# Patient Record
Sex: Female | Born: 1990 | Race: Black or African American | Hispanic: No | Smoking: Former smoker
Health system: Southern US, Community
[De-identification: ages and names within clinical notes are randomized; demographics above are authoritative.]

## PROBLEM LIST (undated history)

## (undated) ENCOUNTER — Ambulatory Visit (HOSPITAL_COMMUNITY): Payer: MEDICAID | Source: Home / Self Care

## (undated) ENCOUNTER — Emergency Department (HOSPITAL_COMMUNITY): Payer: Medicaid Other

## (undated) DIAGNOSIS — B9689 Other specified bacterial agents as the cause of diseases classified elsewhere: Secondary | ICD-10-CM

## (undated) DIAGNOSIS — R87629 Unspecified abnormal cytological findings in specimens from vagina: Secondary | ICD-10-CM

## (undated) DIAGNOSIS — Z5189 Encounter for other specified aftercare: Secondary | ICD-10-CM

## (undated) DIAGNOSIS — D573 Sickle-cell trait: Secondary | ICD-10-CM

## (undated) DIAGNOSIS — F209 Schizophrenia, unspecified: Secondary | ICD-10-CM

## (undated) DIAGNOSIS — T7840XA Allergy, unspecified, initial encounter: Secondary | ICD-10-CM

## (undated) DIAGNOSIS — D649 Anemia, unspecified: Secondary | ICD-10-CM

## (undated) DIAGNOSIS — G709 Myoneural disorder, unspecified: Secondary | ICD-10-CM

## (undated) DIAGNOSIS — L0591 Pilonidal cyst without abscess: Secondary | ICD-10-CM

## (undated) DIAGNOSIS — A549 Gonococcal infection, unspecified: Secondary | ICD-10-CM

## (undated) HISTORY — DX: Schizophrenia, unspecified: F20.9

## (undated) HISTORY — DX: Other specified bacterial agents as the cause of diseases classified elsewhere: B96.89

## (undated) HISTORY — DX: Allergy, unspecified, initial encounter: T78.40XA

## (undated) HISTORY — DX: Pilonidal cyst without abscess: L05.91

## (undated) HISTORY — PX: STYLOID PROCESS EXCISION: SHX5198

## (undated) HISTORY — DX: Myoneural disorder, unspecified: G70.9

## (undated) HISTORY — DX: Encounter for other specified aftercare: Z51.89

---

## 2012-04-30 ENCOUNTER — Ambulatory Visit: Payer: Self-pay | Admitting: Obstetrics & Gynecology

## 2012-05-08 ENCOUNTER — Ambulatory Visit: Payer: Self-pay

## 2012-05-08 ENCOUNTER — Telehealth: Payer: Self-pay | Admitting: *Deleted

## 2012-05-08 NOTE — Telephone Encounter (Signed)
Patient called wanting to know where her depo was.  returned call- left message for patient to have pharmacy contact the office for RF or she could call with current pharmacy information- and she is currently past due for her annual exam.

## 2012-11-04 ENCOUNTER — Ambulatory Visit: Payer: Self-pay

## 2013-09-10 ENCOUNTER — Encounter (HOSPITAL_COMMUNITY): Payer: Self-pay | Admitting: Emergency Medicine

## 2013-09-10 ENCOUNTER — Emergency Department (HOSPITAL_COMMUNITY)
Admission: EM | Admit: 2013-09-10 | Discharge: 2013-09-10 | Disposition: A | Discharge status: Home / Self Care | Payer: Medicaid Other | Attending: Emergency Medicine | Admitting: Emergency Medicine

## 2013-09-10 DIAGNOSIS — R238 Other skin changes: Secondary | ICD-10-CM | POA: Diagnosis present

## 2013-09-10 DIAGNOSIS — L0591 Pilonidal cyst without abscess: Secondary | ICD-10-CM | POA: Diagnosis not present

## 2013-09-10 DIAGNOSIS — Z79899 Other long term (current) drug therapy: Secondary | ICD-10-CM | POA: Diagnosis not present

## 2013-09-10 DIAGNOSIS — Z792 Long term (current) use of antibiotics: Secondary | ICD-10-CM | POA: Insufficient documentation

## 2013-09-10 MED ORDER — CEPHALEXIN 500 MG PO CAPS: 500.0000 mg | ORAL_CAPSULE | Freq: Four times a day (QID) | ORAL | Status: DC

## 2013-09-10 MED ORDER — CEPHALEXIN 250 MG PO CAPS
500.0000 mg | ORAL_CAPSULE | Freq: Once | ORAL | Status: AC
  Administered 2013-09-10: 500 mg via ORAL
  Filled 2013-09-10: qty 2

## 2013-09-10 MED ORDER — OXYCODONE-ACETAMINOPHEN 5-325 MG PO TABS: 2.0000 | ORAL_TABLET | ORAL | Status: DC | PRN

## 2013-09-10 MED ORDER — OXYCODONE-ACETAMINOPHEN 5-325 MG PO TABS
2.0000 | ORAL_TABLET | Freq: Once | ORAL | Status: AC
  Administered 2013-09-10: 2 via ORAL
  Filled 2013-09-10: qty 2

## 2013-09-10 NOTE — ED Provider Notes (Signed)
CSN: [id number]     Arrival date & time 09/10/13  2101 History  This chart was scribed for Bianca Wong, working with Toy CookeyMegan Docherty, MD found by Elon SpannerGarrett Cook, ED Scribe. This patient was seen in room TR09C/TR09C and the patient's care was started at 10:50 PM.    Chief Complaint  Patient presents with  . Cyst    The history is provided by the patient. No language interpreter was used.    HPI Comments: Bianca Wong is a 23 y.o. female who presents to the Emergency Department complaining of gradually worsening area of in the gluteal cleft.  Patient was seen for this complaint 2 days ago at [facility].  She was prescribed Bactrim as well as pain medication and has been compliant.  She was told to come to ED if sx's worsened.    No past medical history on file. No past surgical history on file. No family history on file. History  Substance Use Topics  . Smoking status: Not on file  . Smokeless tobacco: Not on file  . Alcohol Use: Not on file   OB History   No data available     Review of Systems  Skin: Positive for wound.  All other systems reviewed and are negative.     Allergies  Lactose intolerance (gi)  Home Medications   Prior to Admission medications   Medication Sig Start Date End Date Taking? Authorizing Provider  etonogestrel (IMPLANON) 68 MG IMPL implant Inject 1 each into the skin once. Implanted February 23, 2013   Yes Historical Provider, MD  Ketotifen Fumarate (ALLERGY EYE DROPS OP) Place 1 drop into both eyes daily as needed (allergies/itching).   Yes Historical Provider, MD  oxyCODONE-acetaminophen (PERCOCET/ROXICET) 5-325 MG per tablet Take 1-2 tablets by mouth every 4 (four) hours as needed for severe pain.   Yes Historical Provider, MD  sulfamethoxazole-trimethoprim (BACTRIM DS) 800-160 MG per tablet Take 1 tablet by mouth 2 (two) times daily. 10 day course started 09/08/13 pm   Yes Historical Provider, MD   BP 104/59  Pulse 88  Resp  18  Ht 5' (1.524 m)  Wt 135 lb (61.236 kg)  BMI 26.37 kg/m2  SpO2 100%  LMP 08/24/2013 Physical Exam  Nursing note and vitals reviewed. Constitutional: She appears well-developed and well-nourished. No distress.  HENT:  Head: Normocephalic and atraumatic.  Eyes: Conjunctivae are normal.  Neck: Normal range of motion.  Cardiovascular: Normal rate and regular rhythm.  Exam reveals no gallop and no friction rub.   No murmur heard. Pulmonary/Chest: Effort normal and breath sounds normal. She has no wheezes. She has no rales. She exhibits no tenderness.  Musculoskeletal: Normal range of motion.  Neurological: She is alert.  Speech is goal-oriented. Moves limbs without ataxia.   Skin: Skin is warm and dry.  4x4cm area of tenderness and erythema of gluteal cleft. The area is fluctuant without drainage or open wound.   Psychiatric: She has a normal mood and affect. Her behavior is normal.    ED Course  Procedures (including critical care time)  DIAGNOSTIC STUDIES: Oxygen Saturation is 100% on RA, normal by my interpretation.    COORDINATION OF CARE:  10:56 PM Informed patient of plan to perform I & D.  Patient acknowledges and agrees with plan.    10:59 PM INCISION AND DRAINAGE PROCEDURE NOTE: Patient identification was confirmed and verbal consent was obtained. This procedure was performed by Toy CookeyMegan Docherty, MD at 10:59 PM. Site: gluteal cleft Needle size:  27 gauge Anesthetic used (type and amt): lidocaine 1% with epi Blade size: 11 Drainage: 30mL-serosanguinous Complexity: Complex Packing used:none Site anesthetized, incision made over site, wound drained and explored loculations, rinsed with copious amounts of normal saline, wound packed with sterile gauze, covered with dry, sterile dressing.  Pt tolerated procedure well without complications.  Instructions for care discussed verbally and pt provided with additional written instructions for homecare and f/u.  Labs  Review Labs Reviewed - No data to display  Imaging Review No results found.   EKG Interpretation None      MDM   Final diagnoses:  Pilonidal cyst    Patient will be discharged with keflex to take with her bactrim. Patient will have percocet for pain. The area felt fluctuant on my initial exam but I did not express purulent drainage from the area. Patient will be referred to surgery if symptoms do not improve.   I personally performed the services described in this documentation, which was scribed in my presence. The recorded information has been reviewed and is accurate.   Emilia Beck, PA-C 09/11/13 [phone]

## 2013-09-10 NOTE — ED Notes (Signed)
Declined W/C at D/C and was escorted to lobby by RN. 

## 2013-09-10 NOTE — Discharge Instructions (Signed)
Follow up with the recommended doctor as directed. Take keflex with the antibiotics you are already taking. Take percocet as needed for pain. Refer to attached documents for more information.

## 2013-09-10 NOTE — ED Notes (Signed)
Pt has pylodinal cyst. [facility]. Pt reports it is not improving.

## 2013-09-11 NOTE — ED Provider Notes (Signed)
Medical screening examination/treatment/procedure(s) were performed by non-physician practitioner and as supervising physician I was immediately available for consultation/collaboration.  Shirin Echeverry, MD 09/11/13 2011 

## 2013-09-14 ENCOUNTER — Emergency Department (HOSPITAL_COMMUNITY)
Admission: EM | Admit: 2013-09-14 | Discharge: 2013-09-14 | Disposition: A | Discharge status: Home / Self Care | Payer: Medicaid Other | Source: Home / Self Care | Attending: Emergency Medicine | Admitting: Emergency Medicine

## 2013-09-14 ENCOUNTER — Encounter (HOSPITAL_COMMUNITY): Payer: Self-pay | Admitting: Emergency Medicine

## 2013-09-14 DIAGNOSIS — L0591 Pilonidal cyst without abscess: Secondary | ICD-10-CM

## 2013-09-14 MED ORDER — OXYCODONE-ACETAMINOPHEN 5-325 MG PO TABS: ORAL_TABLET | ORAL | Status: DC

## 2013-09-14 NOTE — Discharge Instructions (Signed)
Continue antibiotics.  Do warm sit down tub bath twice daily.  Apply antibiotic ointment and a gauze dressing.  For constipation take Miralax plus Dulcolax.

## 2013-09-14 NOTE — ED Provider Notes (Signed)
  Chief Complaint   Chief Complaint  Patient presents with  . Follow-up    History of Present Illness   Bianca Wong M Wong is a 23 year old female who presents today for follow up on the pilonidal cyst. Symptoms began around August 3 which was 2 weeks ago. She describes pain and swelling in the intergluteal cleft. She was seen on August 13 in the emergency room and had an incision and drainage. She thinks that no pus resulted and they only got blood back. A couple days later, this began to drain spontaneously on his own at a point just below the incision. It still draining a small amount of serosanguineous fluid. It still painful. She was constipated for several days and then had a painful bowel movement today. [facility], in the emergency room Keflex was added. She still taking both of these meds. A culture was not obtained when this was incised and drained, and it was not packed.  Review of Systems   Other than as noted above, the patient denies any of the following symptoms: Systemic:  No fever, chills or sweats. Skin:  No rash or itching.  PMFSH   Past medical history, family history, social history, meds, and allergies were reviewed.   Physical Examination     Vital signs:  BP 102/69  Pulse 75  Temp(Src) 98.4 F (36.9 C) (Oral)  Resp 16  SpO2 97%  LMP 08/24/2013 Skin:  There is an incision site in the intergluteal cleft that has healed up almost completely. There is a draining sinus just below this which drained a small amount of serous sanguinous fluid. There is no fluctuance. There is still slight induration and pain to palpation.  There was no crepitus, necrosis, ecchymosis, or herrhagic bullae. Skin exam was otherwise normal.  No rash. Ext:  Distal pulses were full, patient has full ROM of all joints.  Course in Urgent Care Center   Antibiotic ointment and a sterile dressing were applied.  Assessment   The encounter diagnosis was  Pilonidal cyst.  This appears to be healing well on its own. I do not think she needs any further incision, drainage, or surgery today.  Plan     1.  Meds:  The following meds were prescribed:   New Prescriptions   OXYCODONE-ACETAMINOPHEN (PERCOCET) 5-325 MG PER TABLET    1 to 2 tablets every 6 hours as needed for pain.   Suggested she continue on with her current antibiotic regimen. I suggested MiraLax and Dulcolax combination for the constipation.  2.  Patient Education/Counseling:  The patient was given appropriate handouts, self care instructions, and instructed in symptomatic relief.  She was instructed in wound care.  3.  Follow up:  The patient was instructed to return again if they should become worse in any way.       Reuben Likesavid C Shanah Guimaraes, MD 09/14/13 [phone]

## 2013-09-14 NOTE — ED Notes (Signed)
Patient seen in ed for cyst, started on bactrim and cephalexin and seen 8/13.  Patient reports draining has occurred recently

## 2014-01-29 DIAGNOSIS — T148XXA Other injury of unspecified body region, initial encounter: Secondary | ICD-10-CM

## 2014-01-29 DIAGNOSIS — S0181XA Laceration without foreign body of other part of head, initial encounter: Secondary | ICD-10-CM

## 2014-01-29 HISTORY — DX: Other injury of unspecified body region, initial encounter: T14.8XXA

## 2014-01-29 HISTORY — DX: Laceration without foreign body of other part of head, initial encounter: S01.81XA

## 2014-12-13 ENCOUNTER — Inpatient Hospital Stay (HOSPITAL_COMMUNITY)
Admission: AD | Admit: 2014-12-13 | Discharge: 2014-12-13 | Disposition: A | Discharge status: Home / Self Care | Payer: Self-pay | Source: Ambulatory Visit | Attending: Family Medicine | Admitting: Family Medicine

## 2014-12-13 ENCOUNTER — Encounter (HOSPITAL_COMMUNITY): Payer: Self-pay | Admitting: *Deleted

## 2014-12-13 ENCOUNTER — Emergency Department (HOSPITAL_COMMUNITY)
Admission: EM | Admit: 2014-12-13 | Discharge: 2014-12-13 | Disposition: A | Discharge status: Home / Self Care | Payer: Medicaid Other | Source: Home / Self Care

## 2014-12-13 DIAGNOSIS — A499 Bacterial infection, unspecified: Secondary | ICD-10-CM

## 2014-12-13 DIAGNOSIS — N760 Acute vaginitis: Secondary | ICD-10-CM | POA: Insufficient documentation

## 2014-12-13 DIAGNOSIS — B9689 Other specified bacterial agents as the cause of diseases classified elsewhere: Secondary | ICD-10-CM | POA: Insufficient documentation

## 2014-12-13 HISTORY — DX: Sickle-cell trait: D57.3

## 2014-12-13 HISTORY — DX: Anemia, unspecified: D64.9

## 2014-12-13 HISTORY — DX: Gonococcal infection, unspecified: A54.9

## 2014-12-13 HISTORY — DX: Unspecified abnormal cytological findings in specimens from vagina: R87.629

## 2014-12-13 LAB — URINALYSIS, ROUTINE W REFLEX MICROSCOPIC
Bilirubin Urine: NEGATIVE
Glucose, UA: NEGATIVE mg/dL
Ketones, ur: NEGATIVE mg/dL
LEUKOCYTES UA: NEGATIVE
Nitrite: NEGATIVE
PROTEIN: NEGATIVE mg/dL
Specific Gravity, Urine: 1.02 (ref 1.005–1.030)
Urobilinogen, UA: 2 mg/dL — ABNORMAL HIGH (ref 0.0–1.0)
pH: 6.5 (ref 5.0–8.0)

## 2014-12-13 LAB — URINE MICROSCOPIC-ADD ON

## 2014-12-13 LAB — POCT PREGNANCY, URINE: PREG TEST UR: NEGATIVE

## 2014-12-13 LAB — WET PREP, GENITAL
TRICH WET PREP: NONE SEEN
Yeast Wet Prep HPF POC: NONE SEEN

## 2014-12-13 MED ORDER — IBUPROFEN 800 MG PO TABS: 800.0000 mg | ORAL_TABLET | Freq: Once | ORAL | Status: DC

## 2014-12-13 MED ORDER — METRONIDAZOLE 500 MG PO TABS: 500.0000 mg | ORAL_TABLET | Freq: Two times a day (BID) | ORAL | Status: DC

## 2014-12-13 NOTE — MAU Provider Note (Signed)
History    nullparous in with c/o intermittant abd pain for about a year.Has nexplanon implant device for over a year. Pt is very vague about pain. Menses present. CSN: [id number]  Arrival date & time 12/13/14  [id number]   First Provider Initiated Contact with Patient 12/13/14 1939      No chief complaint on file.   HPI  History reviewed. No pertinent past medical history.  History reviewed. No pertinent past surgical history.  No family history on file.  Social History  Substance Use Topics  . Smoking status: Current Every Day Smoker -- 0.50 packs/day    Types: Cigarettes  . Smokeless tobacco: None  . Alcohol Use: Yes    OB History    Gravida Para Term Preterm AB TAB SAB Ectopic Multiple Living   0 0 0 0 0 0 0 0 0 0       Review of Systems  Constitutional: Negative.   HENT: Negative.   Eyes: Negative.   Respiratory: Negative.   Cardiovascular: Negative.   Gastrointestinal: Positive for abdominal pain.  Endocrine: Negative.   Genitourinary: Negative.   Musculoskeletal: Negative.   Skin: Negative.   Allergic/Immunologic: Negative.   Neurological: Negative.   Hematological: Negative.   Psychiatric/Behavioral: Negative.     Allergies  Lactose intolerance (gi)  Home Medications  No current outpatient prescriptions on file.  LMP 12/13/2014 (Exact Date)  Physical Exam  Constitutional: She is oriented to person, place, and time. She appears well-developed and well-nourished.  HENT:  Head: Normocephalic.  Eyes: Pupils are equal, round, and reactive to light.  Neck: Normal range of motion.  Cardiovascular: Normal rate, regular rhythm, normal heart sounds and intact distal pulses.   Pulmonary/Chest: Effort normal and breath sounds normal.  Abdominal: Soft. Bowel sounds are normal.  Genitourinary: Vagina normal and uterus normal.  Musculoskeletal: Normal range of motion.  Neurological: She is alert and oriented to person, place, and time. She has normal reflexes.   Skin: Skin is warm and dry.  Psychiatric: She has a normal mood and affect. Her behavior is normal. Judgment and thought content normal.    MAU Course  Procedures (including critical care time)  Labs Reviewed  WET PREP, GENITAL  URINALYSIS, ROUTINE W REFLEX MICROSCOPIC (NOT AT [facility])  POCT PREGNANCY, URINE  GC/CHLAMYDIA PROBE AMP ([place]) NOT AT [facility]   No results found.   No diagnosis found.    MDM  Wet prep, gc,chla done. Uterus NSSC, no abnorma discharge.

## 2014-12-13 NOTE — MAU Note (Signed)
Pt c.o of lower back pain along with cycles since having implant. And concerned about pulse rate being different on each arm as she was told by [employer] instructor.

## 2014-12-13 NOTE — Discharge Instructions (Signed)

## 2014-12-14 LAB — GC/CHLAMYDIA PROBE AMP (~~LOC~~) NOT AT ARMC
Chlamydia: NEGATIVE
NEISSERIA GONORRHEA: NEGATIVE

## 2015-01-19 ENCOUNTER — Encounter (HOSPITAL_COMMUNITY): Payer: Self-pay | Admitting: Emergency Medicine

## 2015-01-19 ENCOUNTER — Emergency Department (HOSPITAL_COMMUNITY)
Admission: EM | Admit: 2015-01-19 | Discharge: 2015-01-19 | Disposition: A | Discharge status: Home / Self Care | Payer: Self-pay | Attending: Emergency Medicine | Admitting: Emergency Medicine

## 2015-01-19 DIAGNOSIS — Z793 Long term (current) use of hormonal contraceptives: Secondary | ICD-10-CM | POA: Insufficient documentation

## 2015-01-19 DIAGNOSIS — Z4802 Encounter for removal of sutures: Secondary | ICD-10-CM | POA: Insufficient documentation

## 2015-01-19 DIAGNOSIS — H539 Unspecified visual disturbance: Secondary | ICD-10-CM | POA: Insufficient documentation

## 2015-01-19 DIAGNOSIS — Z791 Long term (current) use of non-steroidal anti-inflammatories (NSAID): Secondary | ICD-10-CM | POA: Insufficient documentation

## 2015-01-19 DIAGNOSIS — F17210 Nicotine dependence, cigarettes, uncomplicated: Secondary | ICD-10-CM | POA: Insufficient documentation

## 2015-01-19 DIAGNOSIS — Z792 Long term (current) use of antibiotics: Secondary | ICD-10-CM | POA: Insufficient documentation

## 2015-01-19 DIAGNOSIS — Z87828 Personal history of other (healed) physical injury and trauma: Secondary | ICD-10-CM | POA: Insufficient documentation

## 2015-01-19 DIAGNOSIS — Z862 Personal history of diseases of the blood and blood-forming organs and certain disorders involving the immune mechanism: Secondary | ICD-10-CM | POA: Insufficient documentation

## 2015-01-19 DIAGNOSIS — Z8619 Personal history of other infectious and parasitic diseases: Secondary | ICD-10-CM | POA: Insufficient documentation

## 2015-01-19 NOTE — ED Notes (Signed)
See PA assessment 

## 2015-01-19 NOTE — Discharge Instructions (Signed)
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
## 2015-01-19 NOTE — ED Notes (Signed)
Pt to ER to have staples removed after having them placed one week ago for a laceration to the head. Pt is a/o x4. VSS. NAD. Pt denies any issues with staples.

## 2015-08-11 ENCOUNTER — Encounter (HOSPITAL_COMMUNITY): Payer: Self-pay | Admitting: Emergency Medicine

## 2015-08-11 ENCOUNTER — Ambulatory Visit (HOSPITAL_COMMUNITY)
Admission: EM | Admit: 2015-08-11 | Discharge: 2015-08-11 | Disposition: A | Discharge status: Home / Self Care | Payer: Self-pay | Attending: Family Medicine | Admitting: Family Medicine

## 2015-08-11 DIAGNOSIS — Z8619 Personal history of other infectious and parasitic diseases: Secondary | ICD-10-CM | POA: Insufficient documentation

## 2015-08-11 DIAGNOSIS — N898 Other specified noninflammatory disorders of vagina: Secondary | ICD-10-CM | POA: Insufficient documentation

## 2015-08-11 DIAGNOSIS — D573 Sickle-cell trait: Secondary | ICD-10-CM | POA: Insufficient documentation

## 2015-08-11 DIAGNOSIS — F17210 Nicotine dependence, cigarettes, uncomplicated: Secondary | ICD-10-CM | POA: Insufficient documentation

## 2015-08-11 DIAGNOSIS — R1030 Lower abdominal pain, unspecified: Secondary | ICD-10-CM | POA: Insufficient documentation

## 2015-08-11 LAB — POCT PREGNANCY, URINE: Preg Test, Ur: NEGATIVE

## 2015-08-11 LAB — POCT URINALYSIS DIP (DEVICE)
BILIRUBIN URINE: NEGATIVE
Glucose, UA: NEGATIVE mg/dL
KETONES UR: 40 mg/dL — AB
Leukocytes, UA: NEGATIVE
Nitrite: NEGATIVE
Protein, ur: NEGATIVE mg/dL
Specific Gravity, Urine: 1.01 (ref 1.005–1.030)
Urobilinogen, UA: 0.2 mg/dL (ref 0.0–1.0)
pH: 5 (ref 5.0–8.0)

## 2015-08-11 MED ORDER — METRONIDAZOLE 500 MG PO TABS: 500.0000 mg | ORAL_TABLET | Freq: Two times a day (BID) | ORAL | Status: DC

## 2015-08-11 NOTE — ED Provider Notes (Signed)
CSN: [id number]     Arrival date & time 08/11/15  1517 History   First MD Initiated Contact with Patient 08/11/15 1607     Chief Complaint  Patient presents with  . Vaginal Discharge  . Abdominal Pain   (Consider location/radiation/quality/duration/timing/severity/associated sxs/prior Treatment) HPI  Bianca Wong is a 25 y.o. female presenting to UC with c/o vaginal discharge and intermittent, mild cramping lower abdominal pain for about 2 weeks. Hx of BV, pt concerned she may have BV again.  Low concern for STDs as she has been using protection and no recent intercourse. Denies fever, chills, n/v/d. Denies dysuria.   Past Medical History  Diagnosis Date  . Vaginal Pap smear, abnormal   . Gonorrhea   . Anemia   . Sickle cell trait [facility](HCC)    Past Surgical History  Procedure Laterality Date  . Styloid process excision     History reviewed. No pertinent family history. Social History  Substance Use Topics  . Smoking status: Current Every Day Smoker -- 0.50 packs/day    Types: Cigarettes  . Smokeless tobacco: None  . Alcohol Use: 0.6 oz/week    1 Glasses of wine per week     Comment: per day   OB History    Gravida Para Term Preterm AB TAB SAB Ectopic Multiple Living   0 0 0 0 0 0 0 0 0 0      Review of Systems  Constitutional: Negative for fever and chills.  Gastrointestinal: Positive for abdominal pain. Negative for nausea, vomiting and diarrhea.  Genitourinary: Positive for vaginal discharge and pelvic pain. Negative for dysuria, urgency, frequency, genital sores and vaginal pain.  Musculoskeletal: Negative for myalgias and back pain.    Allergies  Lactose intolerance (gi)  Home Medications   Prior to Admission medications   Medication Sig Start Date End Date Taking? Authorizing Provider  etonogestrel (IMPLANON) 68 MG IMPL implant Inject 1 each into the skin once. Implanted February 23, 2013   Yes Historical Provider, MD  ibuprofen (ADVIL,MOTRIN) 800  MG tablet Take 1 tablet (800 mg total) by mouth once. 12/13/14   Montez MoritaMarie D Lawson, CNM  Ketotifen Fumarate (ALLERGY EYE DROPS OP) Place 1 drop into both eyes daily as needed (allergies/itching).    Historical Provider, MD  metroNIDAZOLE (FLAGYL) 500 MG tablet Take 1 tablet (500 mg total) by mouth 2 (two) times daily. 12/13/14   Montez MoritaMarie D Lawson, CNM  metroNIDAZOLE (FLAGYL) 500 MG tablet Take 1 tablet (500 mg total) by mouth 2 (two) times daily. One po bid x 7 days 08/11/15   Junius FinnerErin O'Malley, PA-C  oxyCODONE-acetaminophen (PERCOCET) 5-325 MG per tablet 1 to 2 tablets every 6 hours as needed for pain. 09/14/13   Reuben Likesavid C Keller, MD  oxyCODONE-acetaminophen (PERCOCET/ROXICET) 5-325 MG per tablet Take 1-2 tablets by mouth every 4 (four) hours as needed for severe pain.    Historical Provider, MD  oxyCODONE-acetaminophen (PERCOCET/ROXICET) 5-325 MG per tablet Take 2 tablets by mouth every 4 (four) hours as needed for moderate pain or severe pain. 09/10/13   Emilia BeckKaitlyn Szekalski, PA-C   Meds Ordered and Administered this Visit  Medications - No data to display  BP 103/66 mmHg  Pulse 64  Temp(Src) 98.7 F (37.1 C) (Oral)  Resp 18  SpO2 100%  LMP 07/12/2015 (Approximate) No data found.   Physical Exam  Constitutional: She is oriented to person, place, and time. She appears well-developed and well-nourished.  HENT:  Head: Normocephalic and atraumatic.  Eyes: EOM are  normal.  Neck: Normal range of motion.  Cardiovascular: Normal rate.   Pulmonary/Chest: Effort normal. No respiratory distress.  Abdominal: Soft. She exhibits no distension. There is no tenderness.  Genitourinary: Pelvic exam was performed with patient supine. Vaginal discharge found.  Normal external genitalia.  Mild malodorous clear-white vaginal discharge. No CMT, adnexal tenderness or masses.   Musculoskeletal: Normal range of motion.  Neurological: She is alert and oriented to person, place, and time.  Skin: Skin is warm and dry.   Psychiatric: She has a normal mood and affect. Her behavior is normal.  Nursing note and vitals reviewed.   ED Course  Procedures (including critical care time)  Labs Review Labs Reviewed  POCT URINALYSIS DIP (DEVICE) - Abnormal; Notable for the following:    Ketones, ur 40 (*)    Hgb urine dipstick SMALL (*)    All other components within normal limits  POCT PREGNANCY, URINE  CERVICOVAGINAL ANCILLARY ONLY    Imaging Review No results found.   MDM   1. Vaginal discharge   2. Lower abdominal pain    Pt c/o lower abdominal cramping and discharge.  UA: small Hgb and ketones. Pt denies dysuria. Pt notes she is due for menstrual cycle "any day now" Urine preg: negative  Wet prep and GC/chlamydia labs sent. Will start treatment for BV with Flagyl. Advised not to drink alcohol while taking. Patient verbalized understanding and agreement with treatment plan.     Junius Finner, PA-C 08/11/15 1747

## 2015-08-11 NOTE — ED Notes (Signed)
[facility] with a complaint of abdominal pain off and on and a vaginal discharge for 2 weeks.

## 2015-08-11 NOTE — Discharge Instructions (Signed)
°  Do not drink alcohol while taking metronidazole (Flagyl) as it may cause severe nausea and vomiting.

## 2015-08-12 LAB — CERVICOVAGINAL ANCILLARY ONLY
Chlamydia: NEGATIVE
Neisseria Gonorrhea: NEGATIVE
Wet Prep (BD Affirm): POSITIVE — AB

## 2015-08-15 ENCOUNTER — Telehealth (HOSPITAL_COMMUNITY): Payer: Self-pay | Admitting: Emergency Medicine

## 2015-08-15 NOTE — Telephone Encounter (Signed)
Called pt and notified of recent lab results from visit 7/13 Pt ID'd properly... Reports feeling better and vag d/c has subsided Adv pt if sx are not getting better to return  Pt verb understanding Education on safe sex given  Per Dr. Dayton ScrapeMurray,   Notes Recorded by Eustace MooreLaura W Murray, MD on 08/13/2015 at 11:05 AM Please let patient know that test for gardnerella (bacterial vaginosis) was positive; rx [facility] metronidazole [facility].  Tests for gonorrhea/chlamydia were negative. Recheck as needed if symptoms persist. LM

## 2016-10-07 ENCOUNTER — Ambulatory Visit (HOSPITAL_COMMUNITY)
Admission: EM | Admit: 2016-10-07 | Discharge: 2016-10-07 | Disposition: A | Discharge status: Home / Self Care | Payer: Self-pay | Attending: Emergency Medicine | Admitting: Emergency Medicine

## 2016-10-07 ENCOUNTER — Encounter (HOSPITAL_COMMUNITY): Payer: Self-pay | Admitting: Emergency Medicine

## 2016-10-07 ENCOUNTER — Telehealth (HOSPITAL_COMMUNITY): Payer: Self-pay | Admitting: Emergency Medicine

## 2016-10-07 DIAGNOSIS — D573 Sickle-cell trait: Secondary | ICD-10-CM | POA: Insufficient documentation

## 2016-10-07 DIAGNOSIS — J028 Acute pharyngitis due to other specified organisms: Secondary | ICD-10-CM

## 2016-10-07 DIAGNOSIS — J069 Acute upper respiratory infection, unspecified: Secondary | ICD-10-CM | POA: Insufficient documentation

## 2016-10-07 DIAGNOSIS — F17210 Nicotine dependence, cigarettes, uncomplicated: Secondary | ICD-10-CM | POA: Insufficient documentation

## 2016-10-07 DIAGNOSIS — J029 Acute pharyngitis, unspecified: Secondary | ICD-10-CM | POA: Insufficient documentation

## 2016-10-07 DIAGNOSIS — A549 Gonococcal infection, unspecified: Secondary | ICD-10-CM | POA: Insufficient documentation

## 2016-10-07 DIAGNOSIS — E739 Lactose intolerance, unspecified: Secondary | ICD-10-CM | POA: Insufficient documentation

## 2016-10-07 LAB — POCT RAPID STREP A: Streptococcus, Group A Screen (Direct): NEGATIVE

## 2016-10-07 MED ORDER — FLUTICASONE PROPIONATE 50 MCG/ACT NA SUSP: 2.0000 | Freq: Every day | NASAL | 2 refills | Status: DC

## 2016-10-07 MED ORDER — PREDNISONE 20 MG PO TABS
40.0000 mg | ORAL_TABLET | Freq: Once | ORAL | Status: AC
  Administered 2016-10-07: 40 mg via ORAL

## 2016-10-07 MED ORDER — PREDNISONE 20 MG PO TABS
ORAL_TABLET | ORAL | Status: AC
  Filled 2016-10-07: qty 2

## 2016-10-07 MED ORDER — CEFDINIR 300 MG PO CAPS: 300.0000 mg | ORAL_CAPSULE | Freq: Two times a day (BID) | ORAL | 0 refills | Status: DC

## 2016-10-07 NOTE — Discharge Instructions (Signed)
Push fluids.  If you feel like you are not improving and become lethargic follow up with your primary care provider.

## 2016-10-07 NOTE — ED Triage Notes (Signed)
Onset 2 days ago of throat pain, particularly left side of throat.  Gradually the entire throat started hurting.  No cough, intermittent sinus drainage.  Throat pain keeps worsening and body aches.

## 2016-10-07 NOTE — ED Provider Notes (Signed)
[facility]    CSN: [id number] Arrival date & time: 10/07/16  1606    History   Chief Complaint Chief Complaint  Patient presents with  . URI    HPI   Bianca Wong is a 26 y.o. female presenting today with reports of a sore throat and difficulty swallowing secondary to pain. Patient states she had an onset of just a generalized illness and malaise with fever chills and sweating for about 4-5 days. Patient states she's been taking over-the-counter generic DayQuil and NyQuil. Patient reports last dose approximately 2 hours ago. Reports seasonal allergies for which she states she usually only uses eyedrops once her eyes began to water and itch. She poured she has been pushing fluid as she has sickle cell trait.  Past Medical History:  Diagnosis Date  . Anemia   . Gonorrhea   . Sickle cell trait (HCC)   . Vaginal Pap smear, abnormal     There are no active problems to display for this patient.   Past Surgical History:  Procedure Laterality Date  . STYLOID PROCESS EXCISION      OB History    Gravida Para Term Preterm AB Living   0 0 0 0 0 0   SAB TAB Ectopic Multiple Live Births   0 0 0 0         Home Medications    Prior to Admission medications   Medication Sig Start Date End Date Taking? Authorizing Provider  OVER THE COUNTER MEDICATION Day and night cold medicine   Yes [provider]  cefdinir (OMNICEF) 300 MG capsule Take 1 capsule (300 mg total) by mouth 2 (two) times daily. 10/07/16   Servando Salina, NP  etonogestrel (IMPLANON) 68 MG IMPL implant Inject 1 each into the skin once. Implanted February 23, 2013    [provider]  fluticasone [facility]) 50 MCG/ACT nasal spray Place 2 sprays into both nostrils daily. 10/07/16   Servando Salina, NP  ibuprofen (ADVIL,MOTRIN) 800 MG tablet Take 1 tablet (800 mg total) by mouth once. 12/13/14   Montez Morita, CNM  Ketotifen Fumarate (ALLERGY EYE DROPS OP) Place 1 drop into  both eyes daily as needed (allergies/itching).    [provider]  metroNIDAZOLE (FLAGYL) 500 MG tablet Take 1 tablet (500 mg total) by mouth 2 (two) times daily. 12/13/14   Montez Morita, CNM  metroNIDAZOLE (FLAGYL) 500 MG tablet Take 1 tablet (500 mg total) by mouth 2 (two) times daily. One po bid x 7 days 08/11/15   Lurene Shadow, PA-C  oxyCODONE-acetaminophen (PERCOCET) 5-325 MG per tablet 1 to 2 tablets every 6 hours as needed for pain. 09/14/13   Reuben Likes, MD  oxyCODONE-acetaminophen (PERCOCET/ROXICET) 5-325 MG per tablet Take 1-2 tablets by mouth every 4 (four) hours as needed for severe pain.    [provider]  oxyCODONE-acetaminophen (PERCOCET/ROXICET) 5-325 MG per tablet Take 2 tablets by mouth every 4 (four) hours as needed for moderate pain or severe pain. 09/10/13   Emilia Beck, PA-C    Family History No family history on file.  Social History Social History  Substance Use Topics  . Smoking status: Current Every Day Smoker    Packs/day: 0.50    Types: Cigarettes  . Smokeless tobacco: Not on file  . Alcohol use 0.6 oz/week    1 Glasses of wine per week     Comment: per day     Allergies   Lactose  intolerance (gi)   Review of Systems Review of Systems  Constitutional: Positive for chills, fatigue and fever.  HENT: Positive for congestion, ear pain, sinus pressure, sore throat and trouble swallowing.   Eyes: Negative.  Negative for visual disturbance.  Respiratory: Negative.  Negative for cough and shortness of breath.   Cardiovascular: Negative.  Negative for chest pain and leg swelling.  Gastrointestinal: Negative.   Endocrine: Negative.   Genitourinary: Negative.   Musculoskeletal: Negative.  Negative for gait problem and neck stiffness.  Skin: Negative.  Negative for rash.  Allergic/Immunologic: Positive for environmental allergies.  Neurological: Negative.  Negative for dizziness and headaches.  Hematological: Negative.     Psychiatric/Behavioral: Negative.      Physical Exam Triage Vital Signs ED Triage Vitals  Enc Vitals Group     BP 10/07/16 1646 108/61     Pulse Rate 10/07/16 1646 (!) 107     Resp 10/07/16 1646 18     Temp 10/07/16 1646 (!) 100.7 F (38.2 C)     Temp Source 10/07/16 1646 Oral     SpO2 10/07/16 1646 100 %     Weight --      Height --      Head Circumference --      Peak Flow --      Pain Score 10/07/16 1640 9     Pain Loc --      Pain Edu? --      Excl. in GC? --    No data found.   Updated Vital Signs BP 108/61 (BP Location: Left Arm)   Pulse (!) 107   Temp (!) 100.7 F (38.2 C) (Oral)   Resp 18   SpO2 100%   Physical Exam  Constitutional: She appears well-developed and well-nourished. No distress.  HENT:  Head: Normocephalic and atraumatic.  Right Ear: External ear normal.  Left Ear: External ear normal.  Mouth/Throat: Oropharynx is clear and moist. No oropharyngeal exudate.  Bilateral nasal turbinates are boggy and swollen in appearance. Nares are patent bilaterally. Posterior oropharynx swollen and red in appearance. Right tonsil is 4+ left tonsil is 3+ no evidence of abscess or exudate. Right tonsil has white patches.  Eyes: Pupils are equal, round, and reactive to light. Right eye exhibits no discharge. Left eye exhibits no discharge. No scleral icterus.  Neck: Normal range of motion. Neck supple. No tracheal deviation present.  Moderate bilateral cervical lymphadenopathy.  Cardiovascular: Regular rhythm, normal heart sounds and intact distal pulses.  Exam reveals no gallop and no friction rub.   No murmur heard. Patient is mildly tachycardic with a low-grade fever.  Pulmonary/Chest: Effort normal and breath sounds normal. No stridor. No respiratory distress. She has no wheezes. She has no rales. She exhibits no tenderness.  Lymphadenopathy:    She has cervical adenopathy.  Skin: Skin is warm and dry. No rash noted. She is not diaphoretic. No erythema. No  pallor.  Nursing note and vitals reviewed.    UC Treatments / Results  Labs (all labs ordered are listed, but only abnormal results are displayed) Labs Reviewed  POCT RAPID STREP A   Results for orders placed or performed during the hospital encounter of 10/07/16  POCT rapid strep A [facility])  Result Value Ref Range   Streptococcus, Group A Screen (Direct) NEGATIVE NEGATIVE    EKG  EKG Interpretation None       Radiology No results found.  Procedures Procedures (including critical care time)  Medications Ordered in UC  Medications  predniSONE (DELTASONE) tablet 40 mg (40 mg Oral Given 10/07/16 1712)     Initial Impression / Assessment and Plan / UC Course  I have reviewed the triage vital signs and the nursing notes.  Pertinent labs & imaging results that were available during my care of the patient were reviewed by me and considered in my medical decision making (see chart for details).  Most likely recent viral illness with bacterial exacerbation from sinus drainage, facilitating a bacterial pharyngitis.  Final Clinical Impressions(s) / UC Diagnoses   Final diagnoses:  Acute pharyngitis due to other specified organisms    New Prescriptions New Prescriptions   CEFDINIR (OMNICEF) 300 MG CAPSULE    Take 1 capsule (300 mg total) by mouth 2 (two) times daily.   FLUTICASONE (FLONASE) 50 MCG/ACT NASAL SPRAY    Place 2 sprays into both nostrils daily.     Controlled Substance Prescriptions [place] Controlled Substance Registry consulted? Not Applicable   The usual and customary discharge instructions and warnings were given.  The patient verbalizes understanding and agrees to plan of care.  Patient is to follow-up with her primary care provider if symptoms fail to improve or worsen.      Servando Salinaossi, Sheikh Leverich H, NP 10/07/16 [phone]

## 2016-10-07 NOTE — Telephone Encounter (Signed)
Pt called and sts her pharmacy has closed ([facility] [address])  Wants to know if [facility] can [facility]  Per her request... E-Rx meds.

## 2016-10-10 LAB — CULTURE, GROUP A STREP (THRC)

## 2018-11-25 DIAGNOSIS — K5900 Constipation, unspecified: Secondary | ICD-10-CM | POA: Insufficient documentation

## 2018-11-25 DIAGNOSIS — F4310 Post-traumatic stress disorder, unspecified: Secondary | ICD-10-CM | POA: Insufficient documentation

## 2018-11-25 HISTORY — DX: Post-traumatic stress disorder, unspecified: F43.10

## 2019-03-16 DIAGNOSIS — F988 Other specified behavioral and emotional disorders with onset usually occurring in childhood and adolescence: Secondary | ICD-10-CM | POA: Insufficient documentation

## 2019-03-16 HISTORY — DX: Other specified behavioral and emotional disorders with onset usually occurring in childhood and adolescence: F98.8

## 2019-05-01 ENCOUNTER — Ambulatory Visit: Payer: Medicaid Other | Attending: Internal Medicine

## 2019-05-01 DIAGNOSIS — Z23 Encounter for immunization: Secondary | ICD-10-CM

## 2019-05-01 NOTE — Progress Notes (Signed)
   Covid-19 Vaccination Clinic  Name:  Bianca Wong    MRN: [id number] DOB: March 30, 1990  05/01/2019  [name] was observed post Covid-19 immunization for 15 minutes without incident. She was provided with Vaccine Information Sheet and instruction to access the V-Safe system.   Ms. Bianca Wong was instructed to call 911 with any severe reactions post vaccine: Marland Kitchen Difficulty breathing  . Swelling of face and throat  . A fast heartbeat  . A bad rash all over body  . Dizziness and weakness   Immunizations Administered    Name Date Dose VIS Date Route   Pfizer COVID-19 Vaccine 05/01/2019  9:37 AM 0.3 mL 01/09/2019 Intramuscular   Manufacturer: [employer], [employer]   Lot: [id number]   NDC: [address]-3

## 2019-05-26 ENCOUNTER — Ambulatory Visit: Payer: Medicaid Other | Attending: Internal Medicine

## 2019-05-26 DIAGNOSIS — Z23 Encounter for immunization: Secondary | ICD-10-CM

## 2019-05-26 NOTE — Progress Notes (Signed)
   Covid-19 Vaccination Clinic  Name:  Bianca Wong    MRN: [id number] DOB: [birth year]  05/26/2019  [name] Bianca Meuse was observed post Covid-19 immunization for 15 minutes without incident. She was provided with Vaccine Information Sheet and instruction to access the V-Safe system.   Ms. Bianca Wong was instructed to call 911 with any severe reactions post vaccine: Marland Kitchen Difficulty breathing  . Swelling of face and throat  . A fast heartbeat  . A bad rash all over body  . Dizziness and weakness   Immunizations Administered    Name Date Dose VIS Date Route   Pfizer COVID-19 Vaccine 05/26/2019  9:51 AM 0.3 mL 03/25/2018 Intramuscular   Manufacturer: [employer], [employer]   Lot: [id number]   NDC: [address]-4

## 2019-11-11 ENCOUNTER — Ambulatory Visit (HOSPITAL_COMMUNITY)
Admission: EM | Admit: 2019-11-11 | Discharge: 2019-11-11 | Disposition: A | Discharge status: Home / Self Care | Payer: Medicaid Other | Attending: Family Medicine | Admitting: Family Medicine

## 2019-11-11 ENCOUNTER — Encounter (HOSPITAL_COMMUNITY): Payer: Self-pay | Admitting: Emergency Medicine

## 2019-11-11 ENCOUNTER — Other Ambulatory Visit: Payer: Self-pay

## 2019-11-11 DIAGNOSIS — R109 Unspecified abdominal pain: Secondary | ICD-10-CM | POA: Diagnosis not present

## 2019-11-11 DIAGNOSIS — M549 Dorsalgia, unspecified: Secondary | ICD-10-CM | POA: Diagnosis not present

## 2019-11-11 DIAGNOSIS — Z3202 Encounter for pregnancy test, result negative: Secondary | ICD-10-CM | POA: Diagnosis not present

## 2019-11-11 DIAGNOSIS — N898 Other specified noninflammatory disorders of vagina: Secondary | ICD-10-CM | POA: Insufficient documentation

## 2019-11-11 DIAGNOSIS — R102 Pelvic and perineal pain: Secondary | ICD-10-CM | POA: Diagnosis not present

## 2019-11-11 LAB — POCT URINALYSIS DIPSTICK, ED / UC
Bilirubin Urine: NEGATIVE
Glucose, UA: NEGATIVE mg/dL
Ketones, ur: NEGATIVE mg/dL
Leukocytes,Ua: NEGATIVE
Nitrite: NEGATIVE
Protein, ur: NEGATIVE mg/dL
Specific Gravity, Urine: 1.015 (ref 1.005–1.030)
Urobilinogen, UA: 0.2 mg/dL (ref 0.0–1.0)
pH: 5 (ref 5.0–8.0)

## 2019-11-11 LAB — POC URINE PREG, ED: Preg Test, Ur: NEGATIVE

## 2019-11-11 MED ORDER — METRONIDAZOLE 500 MG PO TABS: 500.0000 mg | ORAL_TABLET | Freq: Two times a day (BID) | ORAL | 0 refills | Status: DC

## 2019-11-11 NOTE — Discharge Instructions (Signed)
Take the medication as prescribed for BV Urine did not  show infection today.  See your OB/GYN for further issues.  Ibuprofen or tylenol as needed.

## 2019-11-11 NOTE — ED Triage Notes (Signed)
Pt c/o LLQ abd pain and pelvic pain and left lower back pain for about a year now. Pt states it the last couple of days it has gotten worse. She states she has had some vaginal discharge. She has a hx of BV.

## 2019-11-11 NOTE — ED Provider Notes (Signed)
[facility]    CSN: [id number] Arrival date & time: 11/11/19  1103      History   Chief Complaint Chief Complaint  Patient presents with  . Abdominal Pain  . Pelvic Pain  . Back Pain    HPI Bianca Wong is a 29 y.o. female.   Pt is a 29 year old female that presents with intermittent lower abdominal cramping, pain over the past couple days.  History of chronic lower pelvic pain.  Also history of sciatic nerve pain this time.  She has had some associated vaginal discharge with odor.  Concern for BV.  Would also like to be checked for STDs.  Denies any dysuria, hematuria urinary frequency.  No nausea or vomiting.     Past Medical History:  Diagnosis Date  . Anemia   . Gonorrhea   . Sickle cell trait (HCC)   . Vaginal Pap smear, abnormal     There are no problems to display for this patient.   Past Surgical History:  Procedure Laterality Date  . STYLOID PROCESS EXCISION      OB History    Gravida  0   Para  0   Term  0   Preterm  0   AB  0   Living  0     SAB  0   TAB  0   Ectopic  0   Multiple  0   Live Births               Home Medications    Prior to Admission medications   Medication Sig Start Date End Date Taking? Authorizing Provider  amphetamine-dextroamphetamine (ADDERALL) 10 MG tablet Take 10 mg by mouth in the morning and at bedtime. 05/25/19  Yes [provider]  metroNIDAZOLE (FLAGYL) 500 MG tablet Take 1 tablet (500 mg total) by mouth 2 (two) times daily. 11/11/19   Dahlia Byes A, NP  etonogestrel (IMPLANON) 68 MG IMPL implant Inject 1 each into the skin once. Implanted February 23, 2013  11/11/19  [provider]  fluticasone (FLONASE) 50 MCG/ACT nasal spray Place 2 sprays into both nostrils daily. 10/07/16 11/11/19  Servando Salina, NP    Family History Family History  Family history unknown: Yes    Social History Social History   Tobacco Use  . Smoking status: Current  Every Day Smoker    Packs/day: 0.50    Types: Cigarettes  . Smokeless tobacco: Never Used  Substance Use Topics  . Alcohol use: Yes    Alcohol/week: 1.0 standard drink    Types: 1 Glasses of wine per week    Comment: per day  . Drug use: Yes    Types: Marijuana    Comment: last use Dec 13 2014     Allergies   Lactose intolerance (gi)   Review of Systems Review of Systems   Physical Exam Triage Vital Signs ED Triage Vitals  Enc Vitals Group     BP 11/11/19 1216 (!) 87/58     Pulse Rate 11/11/19 1216 (!) 57     Resp 11/11/19 1216 13     Temp 11/11/19 1216 98.6 F (37 C)     Temp Source 11/11/19 1216 Oral     SpO2 11/11/19 1216 99 %     Weight --      Height --      Head Circumference --      Peak Flow --      Pain Score  11/11/19 1212 4     Pain Loc --      Pain Edu? --      Excl. in GC? --    No data found.  Updated Vital Signs BP 98/63 (BP Location: Right Arm)   Pulse 66   Temp 98.6 F (37 C) (Oral)   Resp 13   LMP 10/23/2019   SpO2 98%   Visual Acuity Right Eye Distance:   Left Eye Distance:   Bilateral Distance:    Right Eye Near:   Left Eye Near:    Bilateral Near:     Physical Exam Vitals and nursing note reviewed.  Constitutional:      General: She is not in acute distress.    Appearance: Normal appearance. She is not ill-appearing, toxic-appearing or diaphoretic.  HENT:     Head: Normocephalic.     Nose: Nose normal.  Eyes:     Conjunctiva/sclera: Conjunctivae normal.  Pulmonary:     Effort: Pulmonary effort is normal.  Musculoskeletal:        General: Normal range of motion.     Cervical back: Normal range of motion.  Skin:    General: Skin is warm and dry.     Findings: No rash.  Neurological:     Mental Status: She is alert.  Psychiatric:        Mood and Affect: Mood normal.      UC Treatments / Results  Labs (all labs ordered are listed, but only abnormal results are displayed) Labs Reviewed  POCT URINALYSIS  DIPSTICK, ED / UC - Abnormal; Notable for the following components:      Result Value   Hgb urine dipstick SMALL (*)    All other components within normal limits  POC URINE PREG, ED  CERVICOVAGINAL ANCILLARY ONLY    EKG   Radiology No results found.  Procedures Procedures (including critical care time)  Medications Ordered in UC Medications - No data to display  Initial Impression / Assessment and Plan / UC Course  I have reviewed the triage vital signs and the nursing notes.  Pertinent labs & imaging results that were available during my care of the patient were reviewed by me and considered in my medical decision making (see chart for details).     Vaginal discharge.  Urine not show any infection today.  Negative pregnancy.  Treating for BV based on symptoms and history.  Recommended seeing an OB/GYN for further issues Ibuprofen or Tylenol as needed Follow up as needed for continued or worsening symptoms  Final Clinical Impressions(s) / UC Diagnoses   Final diagnoses:  Vaginal discharge     Discharge Instructions     Take the medication as prescribed for BV Urine did not  show infection today.  See your OB/GYN for further issues.  Ibuprofen or tylenol as needed.     ED Prescriptions    Medication Sig Dispense Auth. Provider   metroNIDAZOLE (FLAGYL) 500 MG tablet Take 1 tablet (500 mg total) by mouth 2 (two) times daily. 14 tablet Cariah Salatino A, NP     PDMP not reviewed this encounter.   Janace Aris, NP 11/11/19 1347

## 2019-11-12 LAB — CERVICOVAGINAL ANCILLARY ONLY
Bacterial Vaginitis (gardnerella): POSITIVE — AB
Candida Glabrata: NEGATIVE
Candida Vaginitis: NEGATIVE
Chlamydia: NEGATIVE
Comment: NEGATIVE
Comment: NEGATIVE
Comment: NEGATIVE
Comment: NEGATIVE
Comment: NEGATIVE
Comment: NORMAL
Neisseria Gonorrhea: NEGATIVE
Trichomonas: POSITIVE — AB

## 2019-12-30 DIAGNOSIS — J690 Pneumonitis due to inhalation of food and vomit: Secondary | ICD-10-CM

## 2019-12-30 DIAGNOSIS — T5192XA Toxic effect of unspecified alcohol, intentional self-harm, initial encounter: Secondary | ICD-10-CM

## 2019-12-30 HISTORY — DX: Pneumonitis due to inhalation of food and vomit: J69.0

## 2019-12-30 HISTORY — DX: Toxic effect of unspecified alcohol, intentional self-harm, initial encounter: T51.92XA

## 2020-01-15 DIAGNOSIS — T50901A Poisoning by unspecified drugs, medicaments and biological substances, accidental (unintentional), initial encounter: Secondary | ICD-10-CM | POA: Insufficient documentation

## 2020-01-15 DIAGNOSIS — E46 Unspecified protein-calorie malnutrition: Secondary | ICD-10-CM | POA: Insufficient documentation

## 2020-01-16 DIAGNOSIS — T1491XA Suicide attempt, initial encounter: Secondary | ICD-10-CM | POA: Insufficient documentation

## 2020-01-16 DIAGNOSIS — J690 Pneumonitis due to inhalation of food and vomit: Secondary | ICD-10-CM | POA: Insufficient documentation

## 2020-01-16 DIAGNOSIS — D589 Hereditary hemolytic anemia, unspecified: Secondary | ICD-10-CM | POA: Insufficient documentation

## 2020-01-16 HISTORY — DX: Suicide attempt, initial encounter: T14.91XA

## 2020-01-17 DIAGNOSIS — N179 Acute kidney failure, unspecified: Secondary | ICD-10-CM | POA: Insufficient documentation

## 2020-01-17 DIAGNOSIS — K2960 Other gastritis without bleeding: Secondary | ICD-10-CM | POA: Insufficient documentation

## 2020-01-18 DIAGNOSIS — Z299 Encounter for prophylactic measures, unspecified: Secondary | ICD-10-CM | POA: Insufficient documentation

## 2020-01-21 DIAGNOSIS — R45851 Suicidal ideations: Secondary | ICD-10-CM | POA: Insufficient documentation

## 2020-01-25 DIAGNOSIS — F1210 Cannabis abuse, uncomplicated: Secondary | ICD-10-CM

## 2020-01-25 DIAGNOSIS — F29 Unspecified psychosis not due to a substance or known physiological condition: Secondary | ICD-10-CM | POA: Insufficient documentation

## 2020-01-25 DIAGNOSIS — Z720 Tobacco use: Secondary | ICD-10-CM | POA: Insufficient documentation

## 2020-01-25 HISTORY — DX: Unspecified psychosis not due to a substance or known physiological condition: F29

## 2020-01-25 HISTORY — DX: Cannabis abuse, uncomplicated: F12.10

## 2020-05-12 ENCOUNTER — Other Ambulatory Visit: Payer: Self-pay

## 2020-06-10 ENCOUNTER — Ambulatory Visit: Payer: Medicaid Other | Admitting: Gastroenterology

## 2020-06-10 ENCOUNTER — Encounter: Payer: Self-pay | Admitting: Nurse Practitioner

## 2020-06-10 ENCOUNTER — Ambulatory Visit (INDEPENDENT_AMBULATORY_CARE_PROVIDER_SITE_OTHER): Payer: Medicaid Other | Admitting: Nurse Practitioner

## 2020-06-10 ENCOUNTER — Other Ambulatory Visit: Payer: Self-pay

## 2020-06-10 ENCOUNTER — Other Ambulatory Visit (INDEPENDENT_AMBULATORY_CARE_PROVIDER_SITE_OTHER): Payer: Medicaid Other

## 2020-06-10 VITALS — BP 92/66 | HR 80 | Ht 60.0 in | Wt 133.4 lb

## 2020-06-10 DIAGNOSIS — D649 Anemia, unspecified: Secondary | ICD-10-CM

## 2020-06-10 DIAGNOSIS — R1012 Left upper quadrant pain: Secondary | ICD-10-CM

## 2020-06-10 DIAGNOSIS — K5900 Constipation, unspecified: Secondary | ICD-10-CM | POA: Diagnosis not present

## 2020-06-10 DIAGNOSIS — R1032 Left lower quadrant pain: Secondary | ICD-10-CM

## 2020-06-10 LAB — COMPREHENSIVE METABOLIC PANEL
ALT: 9 U/L (ref 0–35)
AST: 15 U/L (ref 0–37)
Albumin: 4.1 g/dL (ref 3.5–5.2)
Alkaline Phosphatase: 73 U/L (ref 39–117)
BUN: 8 mg/dL (ref 6–23)
CO2: 25 mEq/L (ref 19–32)
Calcium: 9.2 mg/dL (ref 8.4–10.5)
Chloride: 105 mEq/L (ref 96–112)
Creatinine, Ser: 0.76 mg/dL (ref 0.40–1.20)
GFR: 105.81 mL/min (ref >60.00)
Glucose, Bld: 88 mg/dL (ref 70–99)
Potassium: 3.9 mEq/L (ref 3.5–5.1)
Sodium: 138 mEq/L (ref 135–145)
Total Bilirubin: 0.4 mg/dL (ref 0.2–1.2)
Total Protein: 7.8 g/dL (ref 6.0–8.3)

## 2020-06-10 LAB — CBC WITH DIFFERENTIAL/PLATELET
Basophils Absolute: 0 10*3/uL (ref 0.0–0.1)
Basophils Relative: 0.3 % (ref 0.0–3.0)
Eosinophils Absolute: 0 10*3/uL (ref 0.0–0.7)
Eosinophils Relative: 0.4 % (ref 0.0–5.0)
HCT: 33.2 % — ABNORMAL LOW (ref 36.0–46.0)
Hemoglobin: 11.2 g/dL — ABNORMAL LOW (ref 12.0–15.0)
Lymphocytes Relative: 24.1 % (ref 12.0–46.0)
Lymphs Abs: 2 10*3/uL (ref 0.7–4.0)
MCHC: 33.7 g/dL (ref 30.0–36.0)
MCV: 89.7 fl (ref 78.0–100.0)
Monocytes Absolute: 0.7 10*3/uL (ref 0.1–1.0)
Monocytes Relative: 8.3 % (ref 3.0–12.0)
Neutro Abs: 5.5 10*3/uL (ref 1.4–7.7)
Neutrophils Relative %: 66.9 % (ref 43.0–77.0)
Platelets: 329 10*3/uL (ref 150.0–400.0)
RBC: 3.71 Mil/uL — ABNORMAL LOW (ref 3.87–5.11)
RDW: 12.5 % (ref 11.5–15.5)
WBC: 8.2 10*3/uL (ref 4.0–10.5)

## 2020-06-10 LAB — C-REACTIVE PROTEIN: CRP: <1 mg/dL (ref 0.5–20.0)

## 2020-06-10 LAB — LIPASE: Lipase: 102 U/L — ABNORMAL HIGH (ref 11.0–59.0)

## 2020-06-10 NOTE — Progress Notes (Addendum)
06/10/2020 Bianca Wong [id number] 1990/06/17   CHIEF COMPLAINT: Abdominal pain   HISTORY OF PRESENT ILLNESS:  Bianca Peruski.  Wong is a 30 year old female with a past medical history of anemia, sickle cell trait, depression, schizophrenia with attempted suicide 01/13/2021. She presents to our office today as referred by Ralene Ok PA-C for further evaluation regarding LLQ pain and for possible "toxic gastritis". She reported drinking 2 to 3 bottles of rubbing alcohol, red wine with 200 Ibuprofen tables 01/14/2020. She was found unresponsive and she transported to the hospital in [place]. She was intubated by EMS en route to the local hospital then air lifted to [facility] hospital. She was admitted into the ICU in shock, intubated on pressors. Ethanol level 51. She had AKI and required CRRT. She was treated with antibiotics for aspiration pneumonia. LFTs were mildly elevated. She was seen by the GI service secondary to acute anemia with Hg 8 -> 6.8 with concern for potential GI bleed in setting of Isopropyl alcohol and NSAID overdose, at risk for hemorrhagic gastritis/PUD. Possible acute myelosuppression. She was transfused 1 unit of  PRBCs. She did not demonstrate any active GI bleeding during her hospital admission from the records I have reviewed in Care Everywhere. The [facility] GI service deferred endoscopic evaluation. She was placed on Pantoprazole 40mg  po bid with recommendations to follow up with GI when after discharged home. She was transferred to the inpatient psychiatry unit 01/21/2020 for an extended period of time.   She presents to our office today, almost 5 months after her suicide attempt. She remains on Pantoprazole 40mg  po bid. Currently, she denies having any dysphagia or heartburn. No RUQ or epigastric pain. She has mild intermittent LUQ and LLQ discomfort which is unrelated to eating. It is unclear if her LUQ pain started before or after her 12/2019 hospital  admission. She stated having intermittent LLQ pain for the past 2 to 3 years which lasts for 5 to 30 minutes.  She is passing normal brown every few days. No rectal bleeding or melena. She drinks Kombucha if she feels constipated which results in passing a moderate amount of stool with relief. She also takes Miralax once every two weeks. She underwent a pelvic sonogram yesterday, results pending. She continues follow up with her psychiatrist. She gained 20 -25 lbs on Seroquel so she was switched to Ziprasidone 2 weeks ago. She has a history of anemia which she stated runs in her family. No further NSAID use. She drank 2 beers a week ago, typically avoids alcohol. She smokes marijuana most days. No other drug use.   Labs 04/11/2020: WBC 8.7. Hg 12.4. HCT 37.9. MCV 89. PLT 329. BUN 12. Cr. 0.82. Alk phos 91. AST 23. ALT 13. HIV nonreactive. Hep A IgM negative. Hep B surface antigen negative. Hep B core IgM negative. Hep C antibody 0.4.    Past Medical History:  Diagnosis Date  . Anemia   . Gonorrhea   . Sickle cell trait (HCC)   . Vaginal Pap smear, abnormal    Past Surgical History:  Procedure Laterality Date  . STYLOID PROCESS EXCISION     Social History: reports that she has been smoking cigarettes. She has been smoking about 0.50 packs per day. She has never used smokeless tobacco. She reports current alcohol use of about 2.0 standard drinks of alcohol per week. She reports current drug use. Drug: Marijuana.   Family History: family history includes Breast cancer in her maternal grandmother;  Cystic fibrosis in her maternal aunt.   Allergies  Allergen Reactions  . Lactose Intolerance (Gi) Other (See Comments)    Gi upset      Outpatient Encounter Medications as of 06/10/2020  Medication Sig  . fluticasone (FLONASE) 50 MCG/ACT nasal spray SMARTSIG:1 Both Nares Daily PRN  . pantoprazole (PROTONIX) 40 MG tablet Take 40 mg by mouth 2 (two) times daily.  . polyethylene glycol powder  (GLYCOLAX/MIRALAX) 17 GM/SCOOP powder Take 1 Container by mouth as needed.  . ziprasidone (GEODON) 20 MG capsule Take 20 mg by mouth 2 (two) times daily with a meal.  . [DISCONTINUED] amphetamine-dextroamphetamine (ADDERALL) 10 MG tablet Take 10 mg by mouth in the morning and at bedtime. (Patient not taking: Reported on 06/10/2020)  . [DISCONTINUED] cyclobenzaprine (FLEXERIL) 5 MG tablet Take one to two tablets at bedtime as needed for muscle spasms. (Patient not taking: Reported on 06/10/2020)  . [DISCONTINUED] etonogestrel (IMPLANON) 68 MG IMPL implant Inject 1 each into the skin once. Implanted February 23, 2013  . [DISCONTINUED] metroNIDAZOLE (FLAGYL) 500 MG tablet Take 1 tablet (500 mg total) by mouth 2 (two) times daily. (Patient not taking: Reported on 06/10/2020)  . [DISCONTINUED] QUEtiapine (SEROQUEL XR) 300 MG 24 hr tablet Take by mouth. (Patient not taking: Reported on 06/10/2020)   No facility-administered encounter medications on file as of 06/10/2020.    REVIEW OF SYSTEMS: Gen: + Fatigue. Denies fever, sweats or chills. No weight loss.  CV: Denies chest pain, palpitations or edema. Resp: Denies cough, shortness of breath of hemoptysis.  GI: See HPI.  GU : + Excessive urination. MS: + Back pain. Derm: Denies rash, itchiness, skin lesions or unhealing ulcers. Psych: See HP.  Heme: Denies bruising, bleeding. Neuro:  Denies headaches, dizziness or paresthesias. Endo:  Denies any problems with DM, thyroid or adrenal function.  PHYSICAL EXAM: BP 92/66 (BP Location: Left Arm, Patient Position: Sitting, Cuff Size: Normal)   Pulse 80   Ht 5' (1.524 m)   Wt 133 lb 6 oz (60.5 kg)   BMI 26.05 kg/m    General: 30 year old female in no acute distress. Head: Normocephalic and atraumatic. Eyes:  Sclerae non-icteric, conjunctive pink. Ears: Normal auditory acuity. Mouth: Dentition intact. No ulcers or lesions.  Neck: Supple, no lymphadenopathy or thyromegaly.  Lungs: Clear bilaterally to  auscultation without wheezes, crackles or rhonchi. Heart: Regular rate and rhythm. No murmur, rub or gallop appreciated.  Abdomen: Soft, nontender, non distended. No masses. No hepatosplenomegaly. Normoactive bowel sounds x 4 quadrants.  Rectal: Deferred.  Musculoskeletal: Symmetrical with no gross deformities. Skin: Warm and dry. No rash or lesions on visible extremities. Extremities: No edema. Neurological: Alert oriented x 4, no focal deficits.  Psychological:  Alert and cooperative. Normal mood and affect.  ASSESSMENT AND PLAN:  50. 30 year old female with a history of depression/psychizophrenia s/p suicide attempt 12/2019 by drinking 2 to 3 bottles of isopropyl alcohol and 200 Ibuprofen tablets presents for further GI evaluation for toxic/hemorrhagic gastritis. She is on Pantoprazole 40mg  po bid. She has vague intermittent LUQ pain which is unrelated to eating. No heartburn. No N/V. No overt GI bleeding.g Hg 12.4. HCT 27.9.  -CBC, CMP, CRP and Lipase level  -Reduce Pantoprazole 40mg  to once daily -I will consult with Dr. Carlean Purl to determine if endoscopic evaluation appropriate at this juncture  -Avoid spicy/acidic foods  2. LLQ pain, intermittent x 2 to 3 years which decreases after she passes an efficient BM. Pelvic sonogram done by gyn  results pending. Negative abdominal exam today.  -Labs as ordered above  -Miralax Q HS as needed  -Recommend CTAP if LUQ and LLQ pains persists or worsen  3. Acute anemia, suspect acute myelosuppression with questionable sickle cell trait.  Acute anemia has resolved. No overt GI bleeding. Labs 04/11/2020 showed Hg 12.4. HCT 37.9.  Patient to follow up with Dr. Carlean Purl in 6 weeks   CC:  [facility] [place]  Attending:  Chart reviewed and note reviewed.  ? Why lipase is elevated  Lab Results  Component Value Date   LIPASE 102.0 (H) 06/10/2020   Start w/u with repeat lipase and US abdomen.  I will have staff order this. Gatha Mayer, MD, [facility] 06/13/2020 4:23 PM

## 2020-06-10 NOTE — Patient Instructions (Signed)
If you are age 30 or younger, your body mass index should be between 19-25. Your Body mass index is 26.05 kg/m. If this is out of the aformentioned range listed, please consider follow up with your Primary Care Provider.   LABS:  Lab work has been ordered for you today. Our lab is located in the basement. Press "B" on the elevator. The lab is located at the first door on the left as you exit the elevator.  RECOMMENDATIONS: Take Pantoprazole 40 MG once a day.  Miralax- Dissolve one capful in 8 ounces of water and drink before bed.  Please Avoid all NSAID's. Some examples of NSAID's are as follows: Aspirin (Bufferin, Bayer, and Excedrin) Ibuprofen (Advil, Motrin, Nuprin) Ketoprofen (Actron, Orudis) Naproxen (Aleve) Daypro  Indocin  Lodine  Naprosyn  Relafen  Vimovo Voltaren  We have scheduled you a follow up with Dr. Leone Payor on 08/18/20 at 11:10 am.  It was great seeing you today! Thank you for entrusting me with your care and choosing [facility].  Arnaldo Natal, CRNP

## 2020-06-13 ENCOUNTER — Other Ambulatory Visit: Payer: Self-pay | Admitting: Internal Medicine

## 2020-06-13 DIAGNOSIS — R748 Abnormal levels of other serum enzymes: Secondary | ICD-10-CM

## 2020-06-13 DIAGNOSIS — R1012 Left upper quadrant pain: Secondary | ICD-10-CM

## 2020-07-04 ENCOUNTER — Other Ambulatory Visit (INDEPENDENT_AMBULATORY_CARE_PROVIDER_SITE_OTHER): Payer: Medicaid Other

## 2020-07-04 DIAGNOSIS — R748 Abnormal levels of other serum enzymes: Secondary | ICD-10-CM

## 2020-07-04 DIAGNOSIS — R1012 Left upper quadrant pain: Secondary | ICD-10-CM | POA: Diagnosis not present

## 2020-07-05 LAB — LIPASE: Lipase: 59 U/L (ref 11.0–59.0)

## 2020-07-08 ENCOUNTER — Other Ambulatory Visit: Payer: Self-pay

## 2020-07-08 DIAGNOSIS — R748 Abnormal levels of other serum enzymes: Secondary | ICD-10-CM

## 2020-07-08 DIAGNOSIS — R1012 Left upper quadrant pain: Secondary | ICD-10-CM

## 2020-08-18 ENCOUNTER — Encounter: Payer: Self-pay | Admitting: Internal Medicine

## 2020-08-18 ENCOUNTER — Ambulatory Visit (INDEPENDENT_AMBULATORY_CARE_PROVIDER_SITE_OTHER): Payer: Medicaid Other | Admitting: Internal Medicine

## 2020-08-18 VITALS — BP 96/56 | HR 56

## 2020-08-18 DIAGNOSIS — R748 Abnormal levels of other serum enzymes: Secondary | ICD-10-CM | POA: Diagnosis not present

## 2020-08-18 DIAGNOSIS — R10814 Left lower quadrant abdominal tenderness: Secondary | ICD-10-CM

## 2020-08-18 DIAGNOSIS — R10812 Left upper quadrant abdominal tenderness: Secondary | ICD-10-CM

## 2020-08-18 DIAGNOSIS — K5900 Constipation, unspecified: Secondary | ICD-10-CM

## 2020-08-18 DIAGNOSIS — R1012 Left upper quadrant pain: Secondary | ICD-10-CM | POA: Diagnosis not present

## 2020-08-18 NOTE — Patient Instructions (Addendum)
Please stop your pantoprazole for 2 weeks prior to doing your H. Pylori stool test.  Your provider has requested that you go to the basement level for lab work before leaving today. Press "B" on the elevator. The lab is located at the first door on the left as you exit the elevator.  Due to recent changes in healthcare laws, you may see the results of your imaging and laboratory studies on MyChart before your provider has had a chance to review them.  We understand that in some cases there may be results that are confusing or concerning to you. Not all laboratory results come back in the same time frame and the provider may be waiting for multiple results in order to interpret others.  Please give [place] 48 hours in order for your provider to thoroughly review all the results before contacting the office for clarification of your results.   You have been scheduled for an abdominal ultrasound at [facility] (1st floor of hospital) on 08/24/20 at 10:00am. Please arrive 15 minutes prior to your appointment for registration. Make certain not to have anything to eat or drink 6 hours prior to your appointment. Should you need to reschedule your appointment, please contact radiology at [phone]. This test typically takes about 30 minutes to perform.    I appreciate the opportunity to care for you. Stan Head, MD, [facility]
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
## 2020-08-24 ENCOUNTER — Ambulatory Visit (HOSPITAL_COMMUNITY): Admission: RE | Admit: 2020-08-24 | Payer: Medicaid Other | Source: Ambulatory Visit

## 2020-09-05 ENCOUNTER — Other Ambulatory Visit: Payer: Self-pay

## 2020-09-05 ENCOUNTER — Ambulatory Visit (HOSPITAL_COMMUNITY)
Admission: EM | Admit: 2020-09-05 | Discharge: 2020-09-05 | Disposition: A | Discharge status: Home / Self Care | Payer: Medicaid Other | Attending: Emergency Medicine | Admitting: Emergency Medicine

## 2020-09-05 ENCOUNTER — Encounter (HOSPITAL_COMMUNITY): Payer: Self-pay | Admitting: Emergency Medicine

## 2020-09-05 ENCOUNTER — Other Ambulatory Visit: Payer: Medicaid Other

## 2020-09-05 DIAGNOSIS — R21 Rash and other nonspecific skin eruption: Secondary | ICD-10-CM | POA: Diagnosis not present

## 2020-09-05 DIAGNOSIS — J111 Influenza due to unidentified influenza virus with other respiratory manifestations: Secondary | ICD-10-CM | POA: Insufficient documentation

## 2020-09-05 LAB — HIV ANTIBODY (ROUTINE TESTING W REFLEX): HIV Screen 4th Generation wRfx: NONREACTIVE

## 2020-09-05 MED ORDER — IBUPROFEN 600 MG PO TABS: 600.0000 mg | ORAL_TABLET | Freq: Four times a day (QID) | ORAL | 0 refills | Status: AC | PRN

## 2020-09-05 MED ORDER — DOXYCYCLINE HYCLATE 100 MG PO CAPS: 100.0000 mg | ORAL_CAPSULE | Freq: Two times a day (BID) | ORAL | 0 refills | Status: AC

## 2020-09-05 NOTE — ED Triage Notes (Addendum)
Patient had a sweaty, sinus congestion, then this improved.  2 days ago noticed a painful, red rash to soles of feet and palms of hands.  Patient has negative covid test.  G[place]ford Peers symptoms have resolved.  Patient was in [place] 2 weeks ago
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
## 2020-09-05 NOTE — Discharge Instructions (Addendum)
I am concerned that this could be [facility] spotted fever.  We will also check for syphilis and HIV.  I am starting you on doxycycline in case this is [place] spotted fever.  You can discontinue the doxycycline if the labs come back normal.  You may take 600 mg of ibuprofen combined with 1000 mg of Tylenol together 3 or 4 times a day as needed for pain.  We will contact you if any of your other labs come back abnormal and we will discuss the appropriate treatment at that time.

## 2020-09-06 ENCOUNTER — Other Ambulatory Visit: Payer: Medicaid Other

## 2020-09-06 DIAGNOSIS — R10814 Left lower quadrant abdominal tenderness: Secondary | ICD-10-CM

## 2020-09-06 DIAGNOSIS — R10812 Left upper quadrant abdominal tenderness: Secondary | ICD-10-CM

## 2020-09-06 DIAGNOSIS — R1012 Left upper quadrant pain: Secondary | ICD-10-CM

## 2020-09-06 DIAGNOSIS — R748 Abnormal levels of other serum enzymes: Secondary | ICD-10-CM

## 2020-09-06 LAB — RPR: RPR Ser Ql: NONREACTIVE

## 2020-09-08 LAB — H. PYLORI ANTIGEN, STOOL: H pylori Ag, Stl: NEGATIVE

## 2020-09-09 ENCOUNTER — Telehealth: Payer: Self-pay | Admitting: Internal Medicine

## 2020-09-09 LAB — ROCKY MTN SPOTTED FVR ABS PNL(IGG+IGM)
RMSF IgG: NEGATIVE
RMSF IgM: 1.08 index — ABNORMAL HIGH (ref 0.00–0.89)

## 2020-09-09 NOTE — Telephone Encounter (Signed)
I have called central scheduling and she is not sure what the question is. I will investigate this further on Monday.

## 2020-09-12 NOTE — Telephone Encounter (Signed)
I have called the [facility] and no Margaretmary Lombard works there. Hopefully she will call back .

## 2020-09-17 ENCOUNTER — Other Ambulatory Visit: Payer: Self-pay

## 2020-09-17 ENCOUNTER — Ambulatory Visit (HOSPITAL_COMMUNITY)
Admission: RE | Admit: 2020-09-17 | Discharge: 2020-09-17 | Disposition: A | Discharge status: Home / Self Care | Payer: Medicaid Other | Source: Ambulatory Visit | Attending: Internal Medicine | Admitting: Internal Medicine

## 2020-09-17 DIAGNOSIS — R10812 Left upper quadrant abdominal tenderness: Secondary | ICD-10-CM | POA: Diagnosis present

## 2020-09-17 DIAGNOSIS — R10814 Left lower quadrant abdominal tenderness: Secondary | ICD-10-CM

## 2020-09-17 DIAGNOSIS — R1012 Left upper quadrant pain: Secondary | ICD-10-CM

## 2020-09-25 ENCOUNTER — Other Ambulatory Visit: Payer: Self-pay

## 2020-09-25 ENCOUNTER — Encounter (HOSPITAL_COMMUNITY): Payer: Self-pay | Admitting: Physician Assistant

## 2020-09-25 ENCOUNTER — Ambulatory Visit (HOSPITAL_COMMUNITY)
Admission: EM | Admit: 2020-09-25 | Discharge: 2020-09-25 | Disposition: A | Discharge status: Home / Self Care | Payer: Medicaid Other | Attending: Physician Assistant | Admitting: Physician Assistant

## 2020-09-25 DIAGNOSIS — R350 Frequency of micturition: Secondary | ICD-10-CM | POA: Insufficient documentation

## 2020-09-25 DIAGNOSIS — R21 Rash and other nonspecific skin eruption: Secondary | ICD-10-CM | POA: Diagnosis not present

## 2020-09-25 DIAGNOSIS — L309 Dermatitis, unspecified: Secondary | ICD-10-CM | POA: Diagnosis not present

## 2020-09-25 DIAGNOSIS — N898 Other specified noninflammatory disorders of vagina: Secondary | ICD-10-CM | POA: Diagnosis not present

## 2020-09-25 LAB — POCT URINALYSIS DIPSTICK, ED / UC
Bilirubin Urine: NEGATIVE
Glucose, UA: NEGATIVE mg/dL
Ketones, ur: NEGATIVE mg/dL
Leukocytes,Ua: NEGATIVE
Nitrite: NEGATIVE
Protein, ur: 30 mg/dL — AB
Specific Gravity, Urine: 1.025 (ref 1.005–1.030)
Urobilinogen, UA: 0.2 mg/dL (ref 0.0–1.0)
pH: 5.5 (ref 5.0–8.0)

## 2020-09-25 MED ORDER — TRIAMCINOLONE ACETONIDE 0.1 % EX CREA: 1.0000 "application " | TOPICAL_CREAM | Freq: Two times a day (BID) | CUTANEOUS | 1 refills | Status: AC

## 2020-09-25 NOTE — ED Triage Notes (Signed)
PT here for re- eval of rash on palms, she has finished her abx, but skin continues to peel on palms  Also wants UA due to increased frequency
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
## 2020-09-25 NOTE — Discharge Instructions (Addendum)
I believe that the skin on your hands is in the process of healing.  Please use triamcinolone cream as needed to help with itching and healing process.  Use hypoallergenic soaps and detergents.  If your symptoms or not improving he should follow-up with a dermatologist.  We will contact you if we need to arrange any treatment based on your urine culture and swab results.  Make sure you are drinking plenty of fluid.  If you have any worsening symptoms please return for reevaluation.

## 2020-09-26 LAB — CERVICOVAGINAL ANCILLARY ONLY
Bacterial Vaginitis (gardnerella): POSITIVE — AB
Candida Glabrata: NEGATIVE
Candida Vaginitis: NEGATIVE
Chlamydia: NEGATIVE
Comment: NEGATIVE
Comment: NEGATIVE
Comment: NEGATIVE
Comment: NEGATIVE
Comment: NEGATIVE
Comment: NORMAL
Neisseria Gonorrhea: NEGATIVE
Trichomonas: NEGATIVE

## 2020-09-26 LAB — URINE CULTURE: Culture: <10000 — AB

## 2020-09-28 ENCOUNTER — Telehealth (HOSPITAL_COMMUNITY): Payer: Self-pay | Admitting: Emergency Medicine

## 2020-09-28 MED ORDER — METRONIDAZOLE 0.75 % VA GEL: 1.0000 | Freq: Every day | VAGINAL | 0 refills | Status: AC

## 2020-10-18 ENCOUNTER — Ambulatory Visit (AMBULATORY_SURGERY_CENTER): Payer: Self-pay | Admitting: *Deleted

## 2020-10-18 ENCOUNTER — Other Ambulatory Visit: Payer: Self-pay

## 2020-10-18 VITALS — Ht 60.0 in | Wt 132.0 lb

## 2020-10-18 DIAGNOSIS — R1012 Left upper quadrant pain: Secondary | ICD-10-CM

## 2020-10-18 NOTE — Progress Notes (Signed)
No egg or soy allergy known to patient  No issues known to pt with past sedation with any surgeries or procedures Patient denies ever being told they had issues or difficulty with intubation  No FH of Malignant Hyperthermia Pt is not on diet pills Pt is not on  home 02  Pt is not on blood thinners  Pt denies issues with constipation  No A fib or A flutter  EMMI video to pt or via MyChart  COVID 19 guidelines implemented in [place] today with Pt and RN   Pt is fully vaccinated  for Covid   Due to the COVID-19 pandemic we are asking patients to follow certain guidelines.  Pt aware of COVID protocols and [facility] guidelines   

## 2020-11-09 ENCOUNTER — Encounter: Payer: Medicaid Other | Admitting: Internal Medicine

## 2020-11-17 ENCOUNTER — Ambulatory Visit (AMBULATORY_SURGERY_CENTER): Payer: Medicaid Other | Admitting: Internal Medicine

## 2020-11-17 ENCOUNTER — Encounter: Payer: Self-pay | Admitting: Internal Medicine

## 2020-11-17 VITALS — BP 116/82 | HR 69 | Temp 97.5°F | Resp 19 | Ht 60.0 in | Wt 132.0 lb

## 2020-11-17 DIAGNOSIS — R1012 Left upper quadrant pain: Secondary | ICD-10-CM

## 2020-11-17 MED ORDER — DICYCLOMINE HCL 20 MG PO TABS: 20.0000 mg | ORAL_TABLET | Freq: Four times a day (QID) | ORAL | 1 refills | Status: DC | PRN

## 2020-11-17 MED ORDER — SODIUM CHLORIDE 0.9 % IV SOLN: 500.0000 mL | Freq: Once | INTRAVENOUS | Status: DC

## 2020-11-17 NOTE — Patient Instructions (Addendum)
The esophagus, stomach and upper intestine all look normal.  The pain is most likely stomach or colon spasms based upon what we know.  Stop pantoprazole.  Go to pharmacy and pick up dicyclomine which treats the spasms.  I appreciate the opportunity to care for you. Iva Boop, MD, FACG   YOU HAD AN ENDOSCOPIC PROCEDURE TODAY AT THE [facility]:   Refer to the procedure report that was given to you for any specific questions about what was found during the examination.  If the procedure report does not answer your questions, please call your gastroenterologist to clarify.  If you requested that your care partner not be given the details of your procedure findings, then the procedure report has been included in a sealed envelope for you to review at your convenience later.  YOU SHOULD EXPECT: Some feelings of bloating in the abdomen. Passage of more gas than usual.  Walking can help get rid of the air that was put into your GI tract during the procedure and reduce the bloating. If you had a lower endoscopy (such as a colonoscopy or flexible sigmoidoscopy) you may notice spotting of blood in your stool or on the toilet paper. If you underwent a bowel prep for your procedure, you may not have a normal bowel movement for a few days.  Please Note:  You might notice some irritation and congestion in your nose or some drainage.  This is from the oxygen used during your procedure.  There is no need for concern and it should clear up in a day or so.  SYMPTOMS TO REPORT IMMEDIATELY:  Following upper endoscopy (EGD)  Vomiting of blood or coffee ground material  New chest pain or pain under the shoulder blades  Painful or persistently difficult swallowing  New shortness of breath  Fever of 100F or higher  Black, tarry-looking stools  For urgent or emergent issues, a gastroenterologist can be reached at any hour by calling [phone]. Do not use MyChart messaging for urgent  concerns.    DIET:  We do recommend a small meal at first, but then you may proceed to your regular diet.  Drink plenty of fluids but you should avoid alcoholic beverages for 24 hours.  ACTIVITY:  You should plan to take it easy for the rest of today and you should NOT DRIVE or use heavy machinery until tomorrow (because of the sedation medicines used during the test).    FOLLOW UP: Our staff will call the number listed on your records 48-72 hours following your procedure to check on you and address any questions or concerns that you may have regarding the information given to you following your procedure. If we do not reach you, we will leave a message.  We will attempt to reach you two times.  During this call, we will ask if you have developed any symptoms of COVID 19. If you develop any symptoms (ie: fever, flu-like symptoms, shortness of breath, cough etc.) before then, please call [phone].  If you test positive for Covid 19 in the 2 weeks post procedure, please call and report this information to [place].    If any biopsies were taken you will be contacted by phone or by letter within the next 1-3 weeks.  Please call us at [phone] if you have not heard about the biopsies in 3 weeks.    SIGNATURES/CONFIDENTIALITY: You and/or your care partner have signed paperwork which will be entered into your electronic medical  record.  These signatures attest to the fact that that the information above on your After Visit Summary has been reviewed and is understood.  Full responsibility of the confidentiality of this discharge information lies with you and/or your care-partner.

## 2020-11-17 NOTE — Progress Notes (Signed)
Pt's states no medical or surgical changes since previsit or office visit. 
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
## 2020-11-17 NOTE — Op Note (Signed)
[facility] Patient Name: Bianca Wong Procedure Date: 11/17/2020 4:21 PM MRN: [id number] Endoscopist: Iva Boop , MD Age: 30 Referring MD:  Date of Birth: 31-Oct-1990 Gender: Female Account #: [id number] Procedure:                Upper GI endoscopy Indications:              Abdominal pain in the left upper quadrant Procedure:                Pre-Anesthesia Assessment:                           - Prior to the procedure, a History and Physical                            was performed, and patient medications and                            allergies were reviewed. The patient's tolerance of                            previous anesthesia was also reviewed. The risks                            and benefits of the procedure and the sedation                            options and risks were discussed with the patient.                            All questions were answered, and informed consent                            was obtained. Prior Anticoagulants: The patient has                            taken no previous anticoagulant or antiplatelet                            agents. ASA Grade Assessment: II - A patient with                            mild systemic disease. After reviewing the risks                            and benefits, the patient was deemed in                            satisfactory condition to undergo the procedure.                           After obtaining informed consent, the endoscope was                            passed under direct vision.  Throughout the                            procedure, the patient's blood pressure, pulse, and                            oxygen saturations were monitored continuously. The                            Endoscope was introduced through the mouth, and                            advanced to the second part of duodenum. The upper                            GI endoscopy was accomplished without difficulty.                             The patient tolerated the procedure well. Scope In: Scope Out: Findings:                 The esophagus was normal.                           The stomach was normal.                           The examined duodenum was normal.                           The cardia and gastric fundus were normal on                            retroflexion. Complications:            No immediate complications. Estimated Blood Loss:     Estimated blood loss: none. Impression:               - Normal esophagus.                           - Normal stomach.                           - Normal examined duodenum.                           - No specimens collected. Recommendation:           - Patient has a contact number available for                            emergencies. The signs and symptoms of potential                            delayed complications were discussed with the  patient. Return to normal activities tomorrow.                            Written discharge instructions were provided to the                            patient.                           - Resume previous diet.                           - Stop pantoprazole                           use dicyclomine 20 mg prn                           F/U GI prn Iva Boop, MD 11/17/2020 5:01:46 PM This report has been signed electronically.

## 2020-11-21 ENCOUNTER — Telehealth: Payer: Self-pay

## 2020-11-21 NOTE — Telephone Encounter (Signed)
  Follow up Call-  Call back number 11/17/2020  Post procedure Call Back phone  # [phone]  Permission to leave phone message Yes  Some recent data might be hidden     Patient questions:  Do you have a fever, pain , or abdominal swelling? No. Pain Score  0 *  Have you tolerated food without any problems? Yes.    Have you been able to return to your normal activities? Yes.    Do you have any questions about your discharge instructions: Diet   No. Medications  No. Follow up visit  No.  Do you have questions or concerns about your Care? No.  Actions: * If pain score is 4 or above: No action needed, pain <4. Have you developed a fever since your procedure? no  2.   Have you had an respiratory symptoms (SOB or cough) since your procedure? no  3.   Have you tested positive for COVID 19 since your procedure no  4.   Have you had any family members/close contacts diagnosed with the COVID 19 since your procedure?  no   If yes to any of these questions please route to Laverna Peace, RN and Karlton Lemon, RN

## 2020-12-15 ENCOUNTER — Other Ambulatory Visit: Payer: Self-pay | Admitting: Internal Medicine

## 2021-01-06 ENCOUNTER — Other Ambulatory Visit: Payer: Self-pay | Admitting: Internal Medicine

## 2021-01-28 ENCOUNTER — Other Ambulatory Visit: Payer: Self-pay | Admitting: Internal Medicine

## 2022-01-06 ENCOUNTER — Encounter (HOSPITAL_COMMUNITY): Payer: Self-pay | Admitting: *Deleted

## 2022-01-06 ENCOUNTER — Ambulatory Visit (HOSPITAL_COMMUNITY)
Admission: EM | Admit: 2022-01-06 | Discharge: 2022-01-06 | Disposition: A | Discharge status: Home / Self Care | Payer: Medicaid Other | Attending: Physician Assistant | Admitting: Physician Assistant

## 2022-01-06 DIAGNOSIS — H6502 Acute serous otitis media, left ear: Secondary | ICD-10-CM | POA: Diagnosis not present

## 2022-01-06 DIAGNOSIS — J069 Acute upper respiratory infection, unspecified: Secondary | ICD-10-CM | POA: Diagnosis not present

## 2022-01-06 DIAGNOSIS — H6993 Unspecified Eustachian tube disorder, bilateral: Secondary | ICD-10-CM

## 2022-01-06 MED ORDER — PSEUDOEPHEDRINE HCL 30 MG PO TABS: 30.0000 mg | ORAL_TABLET | Freq: Three times a day (TID) | ORAL | 0 refills | Status: AC | PRN

## 2022-01-06 MED ORDER — IBUPROFEN 600 MG PO TABS: 600.0000 mg | ORAL_TABLET | Freq: Four times a day (QID) | ORAL | 0 refills | Status: AC | PRN

## 2022-01-06 MED ORDER — AZITHROMYCIN 250 MG PO TABS: 250.0000 mg | ORAL_TABLET | Freq: Every day | ORAL | 0 refills | Status: AC

## 2022-01-06 MED ORDER — FLUTICASONE PROPIONATE 50 MCG/ACT NA SUSP: 2.0000 | Freq: Every day | NASAL | 0 refills | Status: AC

## 2022-01-06 NOTE — Discharge Instructions (Signed)
Advised use of Flonase nasal spray, 2 sprays each nostril once daily until the ears open up and the pain is relieved. Advised take the Zithromax antibiotic, 2 tablets initially and then 1 daily until completed to treat the ear infection. Advised take Sudafed every 8 hours to help reduce your congestion. Advised take ibuprofen 600 mg every 8 hours with food to decrease pain. Advised follow-up PCP or return to urgent care if symptoms fail to improve.
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
## 2022-01-06 NOTE — ED Triage Notes (Addendum)
Pt reports starting with nasal congestion and bilat ear pain (L>R) onset approx 1.5 wks ago. States took OTC meds and congestion resolved, but continues with ear pain. Has taken IBU.

## 2022-08-27 IMAGING — US US ABDOMEN COMPLETE
1 series · 15 of 25 positions shown · non-contrast
Comparison: None.

CLINICAL DATA: Left upper quadrant pain

EXAM:
ABDOMEN ULTRASOUND COMPLETE

[Series 1: us abdomen complete mc & wl · 15 of 105 slices shown]
[im 1/105]
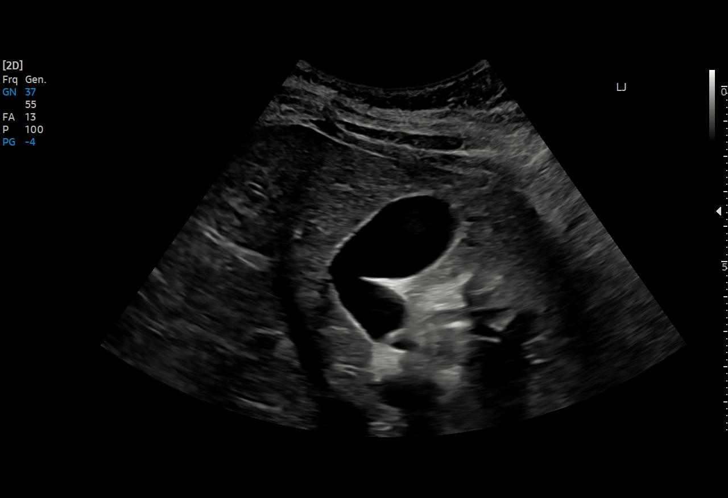
[im 9/105]
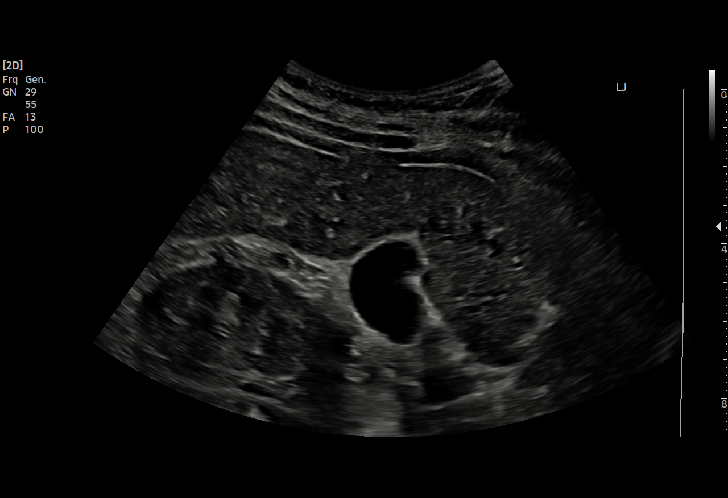
[im 18/105]
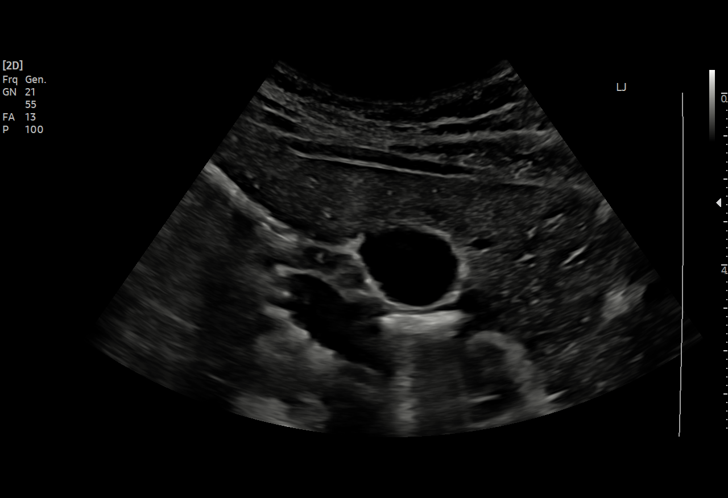
[im 22/105]
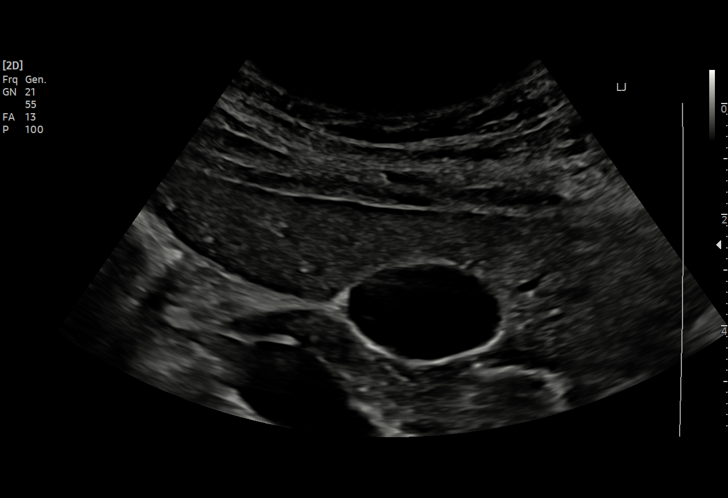
[im 31/105]
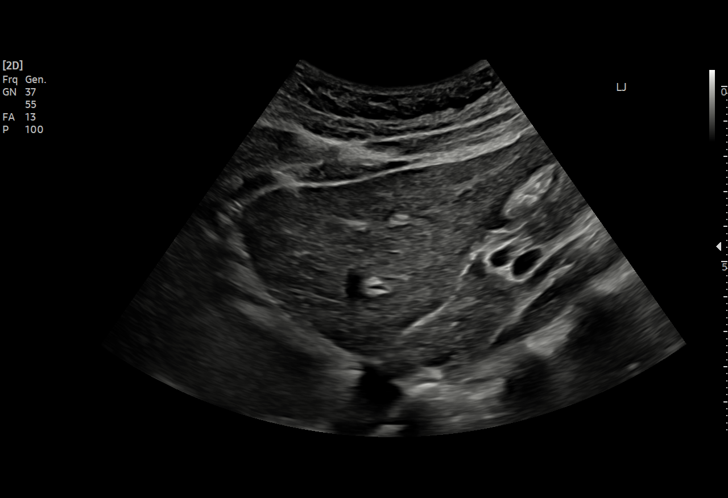
[im 40/105]
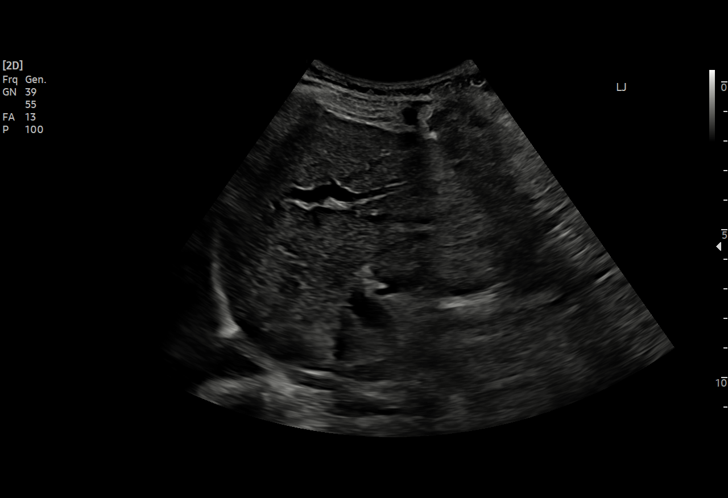
[im 44/105]
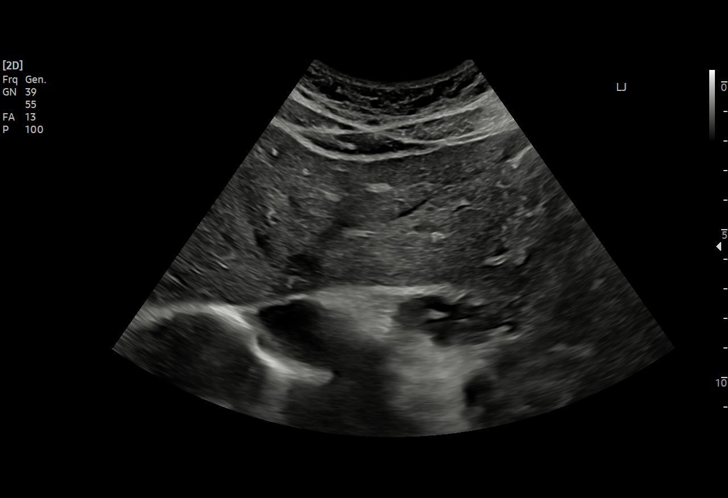
[im 53/105]
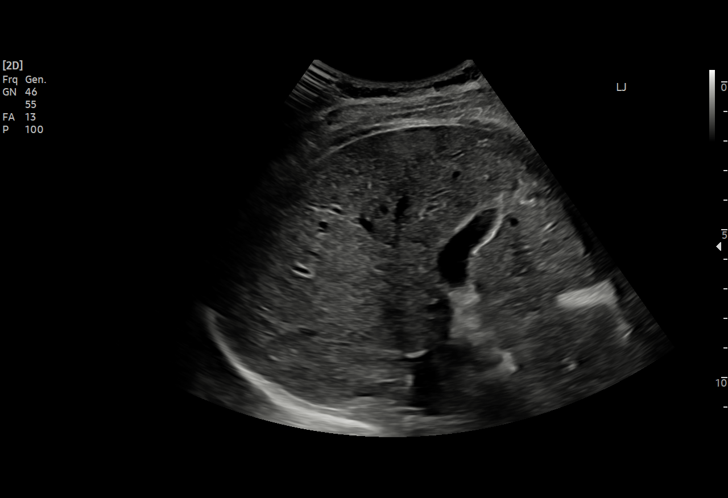
[im 61/105]
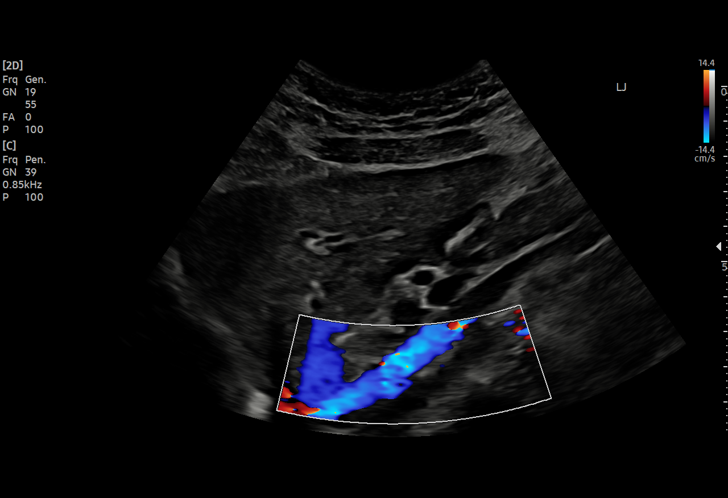
[im 66/105]
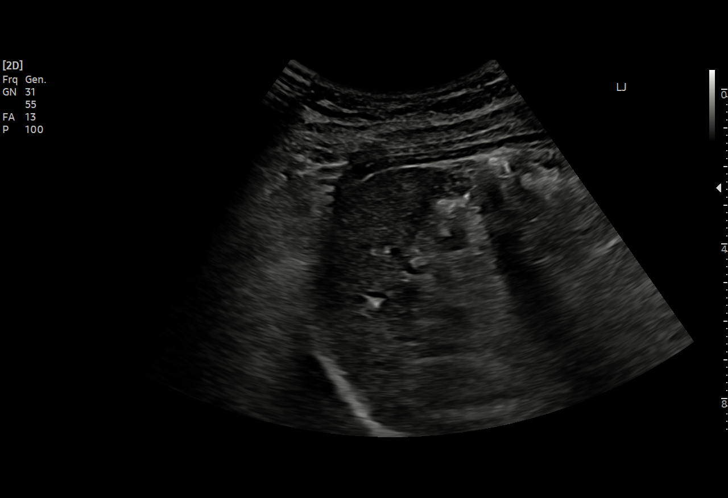
[im 74/105]
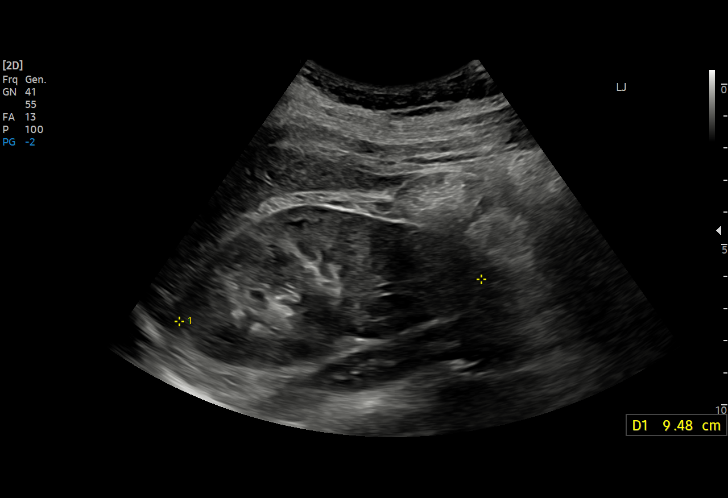
[im 83/105]
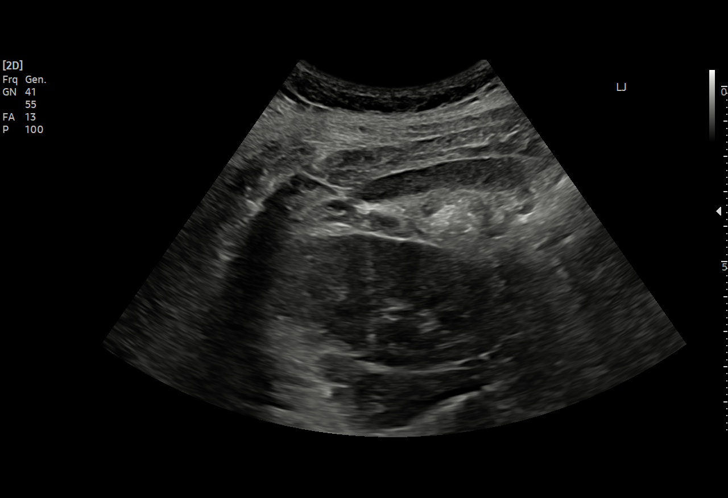
[im 87/105]
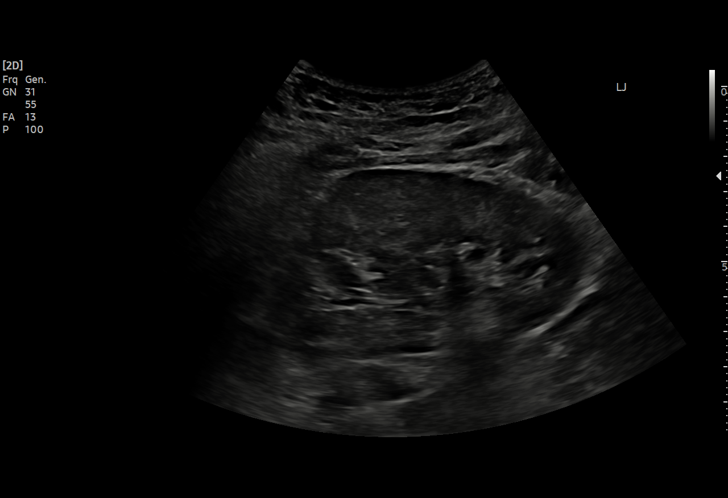
[im 96/105]
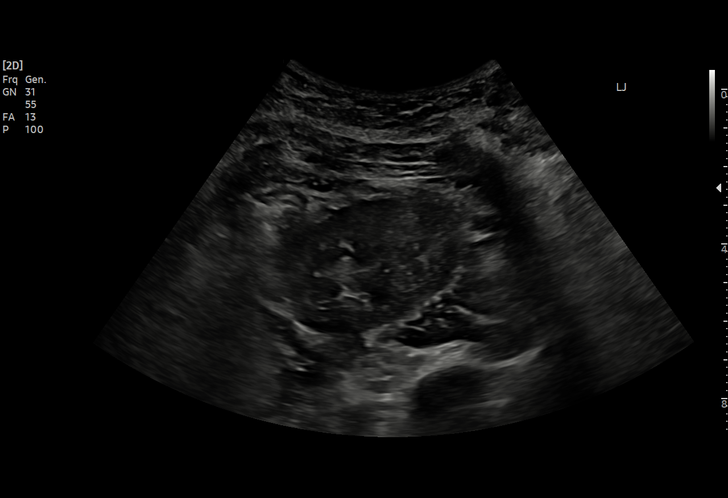
[im 105/105]
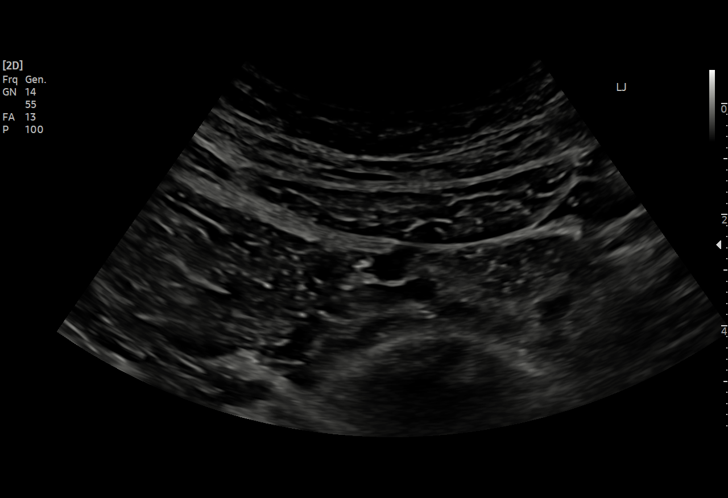

[15 of 25 positions shown; findings below may reference images not displayed]

FINDINGS: Gallbladder: No gallstones or wall thickening visualized. No
sonographic Murphy sign noted by sonographer.

Common bile duct: Diameter: 3 mm

Liver: No focal lesion identified. Within normal limits in
parenchymal echogenicity. Portal vein is patent on color Doppler
imaging with normal direction of blood flow towards the liver.

IVC: No abnormality visualized.

Pancreas: Visualized portion unremarkable.

Spleen: Size and appearance within normal limits.

Right Kidney: Length: 9.5 cm. Echogenicity within normal limits. No
mass or hydronephrosis visualized.

Left Kidney: Length: 8.8 cm. Echogenicity within normal limits. No
mass or hydronephrosis visualized.

Abdominal aorta: No aneurysm visualized.

Other findings: No free fluid
IMPRESSION: No acute finding by abdominal ultrasound.

## 2023-09-26 ENCOUNTER — Ambulatory Visit (HOSPITAL_COMMUNITY)
Admission: EM | Admit: 2023-09-26 | Discharge: 2023-09-26 | Disposition: A | Discharge status: Home / Self Care | Payer: MEDICAID | Attending: Family Medicine | Admitting: Family Medicine

## 2023-09-26 ENCOUNTER — Ambulatory Visit (HOSPITAL_COMMUNITY)

## 2023-09-26 ENCOUNTER — Encounter (HOSPITAL_COMMUNITY): Payer: Self-pay | Admitting: *Deleted

## 2023-09-26 DIAGNOSIS — R350 Frequency of micturition: Secondary | ICD-10-CM | POA: Diagnosis present

## 2023-09-26 LAB — POCT URINALYSIS DIP (MANUAL ENTRY)
Bilirubin, UA: NEGATIVE
Glucose, UA: NEGATIVE mg/dL
Ketones, POC UA: NEGATIVE mg/dL
Leukocytes, UA: NEGATIVE
Nitrite, UA: NEGATIVE
Protein Ur, POC: NEGATIVE mg/dL
Spec Grav, UA: 1.02 (ref 1.010–1.025)
Urobilinogen, UA: 0.2 U/dL
pH, UA: 5.5 (ref 5.0–8.0)

## 2023-09-26 LAB — POCT URINE PREGNANCY: Preg Test, Ur: NEGATIVE
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
## 2023-09-26 NOTE — Discharge Instructions (Signed)
 You have had labs (STI testing and a urine culture) sent today. We will call you with any significant abnormalities or if there is need to begin or change treatment or pursue further follow up.  You may also review your test results online through MyChart. If you do not have a MyChart account, instructions to sign up should be on your discharge paperwork.

## 2023-09-26 NOTE — ED Triage Notes (Addendum)
 C/O dysuria and poyuria onset approx 3 wks ago. Denies any fevers or flank pain. Pt also wishes to be checked for BV and STIs, but denies vaginal discharge.

## 2023-09-27 ENCOUNTER — Ambulatory Visit (HOSPITAL_COMMUNITY): Payer: Self-pay

## 2023-09-27 LAB — CERVICOVAGINAL ANCILLARY ONLY
Bacterial Vaginitis (gardnerella): POSITIVE — AB
Chlamydia: NEGATIVE
Comment: NEGATIVE
Comment: NEGATIVE
Comment: NEGATIVE
Comment: NORMAL
Neisseria Gonorrhea: NEGATIVE
Trichomonas: NEGATIVE

## 2023-09-27 LAB — URINE CULTURE: Culture: NO GROWTH

## 2023-09-27 MED ORDER — METRONIDAZOLE 500 MG PO TABS: 500.0000 mg | ORAL_TABLET | Freq: Two times a day (BID) | ORAL | 0 refills | Status: DC

## 2023-09-28 MED ORDER — METRONIDAZOLE 0.75 % VA GEL: 1.0000 | Freq: Every day | VAGINAL | 0 refills | Status: AC
